# Patient Record
Sex: Male | Born: 1960
Health system: Southern US, Community
[De-identification: ages and names within clinical notes are randomized; demographics above are authoritative.]

## PROBLEM LIST (undated history)

## (undated) DIAGNOSIS — E11319 Type 2 diabetes mellitus with unspecified diabetic retinopathy without macular edema: Secondary | ICD-10-CM

## (undated) DIAGNOSIS — B001 Herpesviral vesicular dermatitis: Secondary | ICD-10-CM

## (undated) DIAGNOSIS — E119 Type 2 diabetes mellitus without complications: Secondary | ICD-10-CM

## (undated) DIAGNOSIS — E785 Hyperlipidemia, unspecified: Secondary | ICD-10-CM

## (undated) DIAGNOSIS — I10 Essential (primary) hypertension: Secondary | ICD-10-CM

## (undated) DIAGNOSIS — N529 Male erectile dysfunction, unspecified: Secondary | ICD-10-CM

## (undated) DIAGNOSIS — H269 Unspecified cataract: Secondary | ICD-10-CM

## (undated) DIAGNOSIS — R319 Hematuria, unspecified: Secondary | ICD-10-CM

## (undated) HISTORY — DX: Type 2 diabetes mellitus with unspecified diabetic retinopathy without macular edema: E11.319

## (undated) HISTORY — DX: Hematuria, unspecified: R31.9

## (undated) HISTORY — DX: Male erectile dysfunction, unspecified: N52.9

## (undated) HISTORY — DX: Unspecified cataract: H26.9

## (undated) HISTORY — DX: Essential (primary) hypertension: I10

## (undated) HISTORY — PX: KNEE ARTHROSCOPY: SUR90

## (undated) HISTORY — DX: Type 2 diabetes mellitus without complications: E11.9

## (undated) HISTORY — DX: Hyperlipidemia, unspecified: E78.5

## (undated) HISTORY — DX: Herpesviral vesicular dermatitis: B00.1

## (undated) NOTE — *Deleted (*Deleted)
[facility] - Clinic Note  04/26/2020  CHIEF COMPLAINT Patient presents for No chief complaint on file.  HISTORY OF PRESENT ILLNESS: Derrick Terry is a 45 y.o. male who presents to the clinic today for:   pt here for focal laser OD today   Referring physician: [name], MD [phone] [address] [place],  [place] [address]  HISTORICAL INFORMATION:   Selected notes from the MEDICAL RECORD NUMBER DEE - Referred by PCP, Dr. [name]   CURRENT MEDICATIONS: Current Outpatient Medications (Ophthalmic Drugs)  Medication Sig  . brimonidine (ALPHAGAN) 0.2 % ophthalmic solution Place 1 drop into both eyes 2 (two) times daily.  . prednisoLONE acetate (PRED FORTE) 1 % ophthalmic suspension Place 1 drop into the right eye 4 (four) times daily.   No current facility-administered medications for this visit. (Ophthalmic Drugs)   Current Outpatient Medications (Other)  Medication Sig  . atorvastatin (LIPITOR) 10 MG tablet Take 10 mg by mouth daily.  . B-D UF III MINI PEN NEEDLES 31G X 5 MM MISC USE TO ADMINISTER VICTOZA ONCE A DAY  . celecoxib (CELEBREX) 200 MG capsule Take 200 mg by mouth daily.  . CONTOUR NEXT TEST test strip USE STRIP TO CHECK GLUCOSE ONCE DAILY  . cyclobenzaprine (FLEXERIL) 10 MG tablet Take 1 tablet (10 mg total) by mouth 2 (two) times daily as needed.  . Empagliflozin-metFORMIN HCl (SYNJARDY PO) Take by mouth.  . levocetirizine (XYZAL) 5 MG tablet Take 5 mg by mouth every evening.  . lidocaine (XYLOCAINE) 5 % ointment Apply topically as needed.  . Liraglutide (VICTOZA [place]) Inject into the skin daily.  . metFORMIN (GLUCOPHAGE) 1000 MG tablet Take 1,000 mg by mouth 2 (two) times daily with a meal.  . Microlet Lancets MISC USE TO CHECK GLUCOSE ONCE DAILY  . phenazopyridine (PYRIDIUM) 100 MG tablet Take 100 mg by mouth 3 (three) times daily as needed.  . pioglitazone (ACTOS) 45 MG tablet Take 45 mg by mouth daily.  . predniSONE  (STERAPRED UNI-PAK 48 TAB) 5 MG (48) TBPK tablet See admin instructions. (Patient not taking: Reported on 04/04/2020)  . quinapril (ACCUPRIL) 10 MG tablet Take 10 mg by mouth daily.  . tadalafil (CIALIS) 20 MG tablet Take 20 mg by mouth daily as needed.  . valACYclovir (VALTREX) 1000 MG tablet SMARTSIG:2 Tablet(s) By Mouth Every 12 Hours PRN  . valACYclovir (VALTREX) 500 MG tablet Take 500 mg by mouth as needed.   Marland Kitchen VICTOZA 18 MG/3ML SOPN Inject 1.8 mg into the skin daily.   No current facility-administered medications for this visit. (Other)      REVIEW OF SYSTEMS:    ALLERGIES Allergies  Allergen Reactions  . Toradol [Ketorolac Tromethamine]     itching    PAST MEDICAL HISTORY Past Medical History:  Diagnosis Date  . Diabetes mellitus without complication (HCC)   . Diabetic retinopathy (HCC)   . ED (erectile dysfunction)   . Hematuria   . Herpes labialis    Past Surgical History:  Procedure Laterality Date  . KNEE ARTHROSCOPY Left   . KNEE ARTHROSCOPY Right     FAMILY HISTORY No family history on file.  SOCIAL HISTORY Social History   Tobacco Use  . Smoking status: Never Smoker  . Smokeless tobacco: Never Used  Substance Use Topics  . Alcohol use: No  . Drug use: No         OPHTHALMIC EXAM:  Not recorded     IMAGING AND  PROCEDURES  Imaging and Procedures for @TODAY @           ASSESSMENT/PLAN:    ICD-10-CM   1. Moderate nonproliferative diabetic retinopathy of both eyes with macular edema associated with type 2 diabetes mellitus (HCC)  Z61.0960   2. Retinal edema  H35.81 OCT, Retina - OU - Both Eyes  3. Combined forms of age-related cataract of both eyes  H25.813   4. Ocular hypertension, bilateral  H40.053   5. Hypertensive retinopathy of both eyes  H35.033     1,2. Moderate Non-proliferative diabetic retinopathy, OU             - S/P IVA OD #1 (11.03.20), #2 (12.01.20), #3 (01.15.21), #4 (02.12.21)  - S/P IVA OS #1 (11.16.20), #2  (01.15.21), #3 (02.12.21), #4 (03.12.21), #5 (04.09.21)  - discussed possible resistance to IVA OD and possible switch in medication  - S/P IVE OD #1 (03.12.21), #2 (04.09.21), #3 (05.13.21), #4 (06.10.21), #5 (07.15.21), #6 (08.30.21), #7 (09.27.21)             - S/P IVE OS #1 (05.13.21), #2 (06.10.21), #3 (07.15.21), #4 (08.30.21)             - S/P focal laser OS (09.13.21)  - S/P focal laser OD (11.01.21)  - FA (11.16.2020) shows late leaking MA OU, mild peripheral nonperfusion OU, no NV OU  - exam shows scattered MA, IRH, clustered centrally OU  - OCT shows OD: Mild Interval increase in central cysts; OS: mild Interval improvement in cystic changes IT macula (post focal laser)  - recommend IVE OU (OD #8 and OS #5) today, 11.23.21  - pt wishes to proceed with injections  - RBA of procedure discussed, questions answered  - Avastin informed consent form signed and scanned on 01.15.2021  - Eylea informed consent form signed and scanned on 03.12.2021  - see procedure note  - Eylea4U benefits investigation started 02.12.21 -- approved for 2021 (+CoPay card)  - f/u 3 weeks -- DFE/OCT, possible injection(s)  3. Mixed form age related cataract OU  - The symptoms of cataract, surgical options, and treatments and risks were discussed with patient.  - discussed diagnosis and progression  - not yet visually significant  - monitor for now  4. Ocular Hypertension OU  - IOP today -- 21,24  - cont brimonidine BID OU   Ophthalmic Meds Ordered this visit:  No orders of the defined types were placed in this encounter.  No follow-ups on file.  There are no Patient Instructions on file for this visit.  This document serves as a record of services personally performed by Karie Chimera, MD, PhD. It was created on their behalf by Glee Arvin. Manson Passey, OA an ophthalmic technician. The creation of this record is the provider's dictation and/or activities during the visit.    Electronically signed by:  Glee Arvin. Kristopher Oppenheim 11.22.2021 12:31 PM  Karie Chimera, M.D., Ph.D. Diseases & Surgery of the Retina and Vitreous [facility]   Abbreviations: M myopia (nearsighted); A astigmatism; H hyperopia (farsighted); P presbyopia; Mrx spectacle prescription;  CTL contact lenses; OD right eye; OS left eye; OU both eyes  XT exotropia; ET esotropia; PEK punctate epithelial keratitis; PEE punctate epithelial erosions; DES dry eye syndrome; MGD meibomian gland dysfunction; ATs artificial tears; PFAT's preservative free artificial tears; NSC nuclear sclerotic cataract; PSC posterior subcapsular cataract; ERM epi-retinal membrane; PVD posterior vitreous detachment; RD retinal detachment; DM diabetes mellitus; DR diabetic retinopathy; NPDR  non-proliferative diabetic retinopathy; PDR proliferative diabetic retinopathy; CSME clinically significant macular edema; DME diabetic macular edema; dbh dot blot hemorrhages; CWS cotton wool spot; POAG primary open angle glaucoma; C/D cup-to-disc ratio; HVF humphrey visual field; GVF goldmann visual field; OCT optical coherence tomography; IOP intraocular pressure; BRVO Branch retinal vein occlusion; CRVO central retinal vein occlusion; CRAO central retinal artery occlusion; BRAO branch retinal artery occlusion; RT retinal tear; SB scleral buckle; PPV pars plana vitrectomy; VH Vitreous hemorrhage; PRP panretinal laser photocoagulation; IVK intravitreal kenalog; VMT vitreomacular traction; MH Macular hole;  NVD neovascularization of the disc; NVE neovascularization elsewhere; AREDS age related eye disease study; ARMD age related macular degeneration; POAG primary open angle glaucoma; EBMD epithelial/anterior basement membrane dystrophy; ACIOL anterior chamber intraocular lens; IOL intraocular lens; PCIOL posterior chamber intraocular lens; Phaco/IOL phacoemulsification with intraocular lens placement; PRK photorefractive keratectomy; LASIK laser assisted in situ  keratomileusis; HTN hypertension; DM diabetes mellitus; COPD chronic obstructive pulmonary disease

---

## 2000-08-26 ENCOUNTER — Ambulatory Visit (HOSPITAL_COMMUNITY): Admission: RE | Admit: 2000-08-26 | Discharge: 2000-08-26 | Payer: Self-pay | Admitting: Specialist

## 2000-08-26 ENCOUNTER — Encounter: Payer: Self-pay | Admitting: Specialist

## 2008-11-09 ENCOUNTER — Encounter: Admission: RE | Admit: 2008-11-09 | Discharge: 2008-11-09 | Payer: Self-pay | Admitting: Unknown Physician Specialty

## 2008-12-01 ENCOUNTER — Ambulatory Visit (HOSPITAL_COMMUNITY): Admission: RE | Admit: 2008-12-01 | Discharge: 2008-12-01 | Payer: Self-pay | Admitting: Plastic Surgery

## 2012-12-26 ENCOUNTER — Encounter (INDEPENDENT_AMBULATORY_CARE_PROVIDER_SITE_OTHER): Payer: Self-pay

## 2013-01-06 ENCOUNTER — Ambulatory Visit (INDEPENDENT_AMBULATORY_CARE_PROVIDER_SITE_OTHER): Payer: Self-pay | Admitting: General Surgery

## 2013-01-13 ENCOUNTER — Ambulatory Visit (INDEPENDENT_AMBULATORY_CARE_PROVIDER_SITE_OTHER): Payer: PRIVATE HEALTH INSURANCE | Admitting: General Surgery

## 2013-01-13 ENCOUNTER — Encounter (INDEPENDENT_AMBULATORY_CARE_PROVIDER_SITE_OTHER): Payer: Self-pay | Admitting: General Surgery

## 2013-01-13 VITALS — BP 120/80 | HR 84 | Temp 97.8°F | Resp 12 | Ht 70.0 in | Wt 190.0 lb

## 2013-01-13 DIAGNOSIS — K6289 Other specified diseases of anus and rectum: Secondary | ICD-10-CM

## 2013-01-13 DIAGNOSIS — K602 Anal fissure, unspecified: Secondary | ICD-10-CM

## 2013-01-13 MED ORDER — LIDOCAINE 5 % EX OINT: TOPICAL_OINTMENT | CUTANEOUS | Status: AC | PRN

## 2013-01-13 NOTE — Progress Notes (Signed)
Chief Complaint  Patient presents with  . Rectal Problems    HISTORY: Derrick Terry is a 52 y.o. male who presents to the office with anal pain.  Other symptoms include occasional blood on toilet paper.  This had been occurring for about 6 months.  He has tried hemorrhoid creams, increasing fiber and stool softeners in the past with some success.  BM's, sitting, coughing makes the symptoms worse.   He is afraid to have a BM.  It is continuous in nature.  His bowel habits are irregular and his bowel movements are soft.  His fiber intake is through a supplement.  His last colonoscopy was ~3 yrs ago and negative.       Past Medical History  Diagnosis Date  . Diabetes mellitus without complication   . Hypertension   . ED (erectile dysfunction)   . Herpes labialis   . Hematuria   . Hyperlipidemia   . Diabetic retinopathy       Past Surgical History  Procedure Laterality Date  . Knee arthroscopy Left   . Knee arthroscopy Right         Current Outpatient Prescriptions  Medication Sig Dispense Refill  . celecoxib (CELEBREX) 200 MG capsule Take 200 mg by mouth daily.      Marland Kitchen levocetirizine (XYZAL) 5 MG tablet Take 5 mg by mouth every evening.      . Liraglutide (VICTOZA [place]) Inject into the skin daily.      . metFORMIN (GLUCOPHAGE) 1000 MG tablet Take 1,000 mg by mouth 2 (two) times daily with a meal.      . quinapril (ACCUPRIL) 10 MG tablet Take 10 mg by mouth daily.      . valACYclovir (VALTREX) 500 MG tablet Take 500 mg by mouth as needed.       . lidocaine (XYLOCAINE) 5 % ointment Apply topically as needed.  35.44 g  0   No current facility-administered medications for this visit.      No Known Allergies    No family history on file.  History   Social History  . Marital Status: Single    Spouse Name: N/A    Number of Children: N/A  . Years of Education: N/A   Social History Main Topics  . Smoking status: Never Smoker   . Smokeless tobacco: None  . Alcohol Use: No  .  Drug Use: No  . Sexually Active: None   Other Topics Concern  . None   Social History Narrative  . None      REVIEW OF SYSTEMS - PERTINENT POSITIVES ONLY: Review of Systems - General ROS: negative for - chills, fever or weight loss Hematological and Lymphatic ROS: negative for - bleeding problems, blood clots or bruising Respiratory ROS: no cough, shortness of breath, or wheezing Cardiovascular ROS: no chest pain or dyspnea on exertion Gastrointestinal ROS: no abdominal pain, change in bowel habits, or black or bloody stools Genito-Urinary ROS: no dysuria, trouble voiding, or hematuria  EXAM: Filed Vitals:   01/13/13 0918  BP: 120/80  Pulse: 84  Temp: 97.8 F (36.6 C)  Resp: 12    General appearance: alert and cooperative Resp: clear to auscultation bilaterally Cardio: regular rate and rhythm GI: soft, non-tender; bowel sounds normal; no masses,  no organomegaly  Ana Exam Findings: post fissure    ASSESSMENT AND PLAN: Derrick Terry is a 52 y.o. M with a chronic anal fissure.  I will start him on diltiazem cream along with Sitz  baths.  He will continue the stool softener and stop the proctozone.  I will see him back in 6 weeks.    Derrick Panda, MD Colon and Rectal Surgery / General Surgery [facility]      Visit Diagnoses: 1. Anal fissure   2. Pain, anal     Primary Care Physician: Derrick Blamer, MD

## 2013-01-13 NOTE — Patient Instructions (Signed)

## 2013-02-24 ENCOUNTER — Encounter (INDEPENDENT_AMBULATORY_CARE_PROVIDER_SITE_OTHER): Payer: Self-pay | Admitting: General Surgery

## 2013-02-24 ENCOUNTER — Ambulatory Visit (INDEPENDENT_AMBULATORY_CARE_PROVIDER_SITE_OTHER): Payer: PRIVATE HEALTH INSURANCE | Admitting: General Surgery

## 2013-02-24 VITALS — BP 118/82 | HR 84 | Temp 98.4°F | Resp 18 | Wt 188.5 lb

## 2013-02-24 DIAGNOSIS — K602 Anal fissure, unspecified: Secondary | ICD-10-CM

## 2013-02-24 NOTE — Patient Instructions (Signed)
Cont Diltiazem cream for 4-6 more weeks.  Call the office if your symptoms do not go away.

## 2013-02-24 NOTE — Progress Notes (Signed)
Derrick Terry is a 52 y.o. male who is here for a follow up visit regarding his chronic anal fissure.  HISTORY: Derrick Terry is a 52 y.o. male who presented to the office with anal pain. Other symptoms include occasional blood on toilet paper. This had been occurring for about 6 months. He has tried hemorrhoid creams, increasing fiber and stool softeners in the past with some success. BM's, sitting, coughing makes the symptoms worse. He is afraid to have a BM. It is continuous in nature. His bowel habits are irregular and his bowel movements are soft. His fiber intake is through a supplement. His last colonoscopy was ~3 yrs ago and negative.    Objective: Filed Vitals:   02/24/13 1014  BP: 118/82  Pulse: 84  Temp: 98.4 F (36.9 C)  Resp: 18    General appearance: alert and cooperative Anal: fissure healing, int exam reveals no palpable masses  Assessment and Plan: Doing well.  He will call the office if his symptoms don't cont to improve.  I will refill his diltiazem    .Vanita Panda, MD [facility], [place] [phone]

## 2014-11-12 ENCOUNTER — Other Ambulatory Visit: Payer: Self-pay | Admitting: Orthopedic Surgery

## 2014-11-19 ENCOUNTER — Other Ambulatory Visit: Payer: Self-pay | Admitting: Physician Assistant

## 2014-11-19 ENCOUNTER — Ambulatory Visit
Admission: RE | Admit: 2014-11-19 | Discharge: 2014-11-19 | Disposition: A | Discharge status: Home / Self Care | Payer: PRIVATE HEALTH INSURANCE | Source: Ambulatory Visit | Attending: Physician Assistant | Admitting: Physician Assistant

## 2014-11-19 DIAGNOSIS — Z01818 Encounter for other preprocedural examination: Secondary | ICD-10-CM

## 2014-12-16 ENCOUNTER — Inpatient Hospital Stay (HOSPITAL_COMMUNITY): Admission: RE | Admit: 2014-12-16 | Payer: Self-pay | Source: Ambulatory Visit | Admitting: Specialist

## 2014-12-16 ENCOUNTER — Encounter (HOSPITAL_COMMUNITY): Admission: RE | Payer: Self-pay | Source: Ambulatory Visit

## 2014-12-16 SURGERY — ARTHROPLASTY, KNEE, TOTAL
Anesthesia: Spinal | Site: Knee | Laterality: Right

## 2015-01-10 ENCOUNTER — Ambulatory Visit (INDEPENDENT_AMBULATORY_CARE_PROVIDER_SITE_OTHER): Payer: PRIVATE HEALTH INSURANCE | Admitting: Rehabilitative and Restorative Service Providers"

## 2015-01-10 ENCOUNTER — Encounter: Payer: Self-pay | Admitting: Rehabilitative and Restorative Service Providers"

## 2015-01-10 DIAGNOSIS — M25662 Stiffness of left knee, not elsewhere classified: Secondary | ICD-10-CM | POA: Diagnosis not present

## 2015-01-10 DIAGNOSIS — Z7409 Other reduced mobility: Secondary | ICD-10-CM | POA: Diagnosis not present

## 2015-01-10 DIAGNOSIS — R29898 Other symptoms and signs involving the musculoskeletal system: Secondary | ICD-10-CM

## 2015-01-10 DIAGNOSIS — M25661 Stiffness of right knee, not elsewhere classified: Secondary | ICD-10-CM

## 2015-01-10 DIAGNOSIS — R269 Unspecified abnormalities of gait and mobility: Secondary | ICD-10-CM

## 2015-01-10 DIAGNOSIS — R531 Weakness: Secondary | ICD-10-CM

## 2015-01-10 NOTE — Patient Instructions (Signed)
*   Please see hand outs on ways to increase knee flexion and extension.   Straight Leg Raise   Tighten stomach and slowly raise locked right leg __6-8__ inches from floor. Repeat __10__ times per set. Do _2-3___ sets per session. Do ___1_ sessions per day.    Hip Extension (Prone)   Lift left leg _2_ inches from floor, keeping knee locked. Repeat __10__ times per set. Do _2-3___ sets per session. Do _1___ sessions per day.  Strengthening: Hip Abduction (Side2-Lying)   Tighten muscles on front of left thigh, then lift leg _6-8___ inches from surface, keeping knee locked.  Repeat _10___ times per set. Do _2-3___ sets per session. Do __1__ sessions per day.    [facility] [address] [place], [place] [address]  [phone] (office) [phone] (fax)

## 2015-01-10 NOTE — Therapy (Signed)
[facility] [place] [address] [place], [place], [address] Phone: [phone]   Fax:  [phone]  Physical Therapy Evaluation  Patient Details  Name: Derrick Terry MRN: [id number] Date of Birth: 03/11/1961 Referring Provider:  Tarry Kos, MD  Encounter Date: 01/10/2015      PT End of Session - 01/10/15 0829    Visit Number 1   Number of Visits 16   Date for PT Re-Evaluation 03/07/15   PT Start Time 0805   PT Stop Time 0910   PT Time Calculation (min) 65 min   Activity Tolerance Patient tolerated treatment well      Past Medical History  Diagnosis Date  . Diabetes mellitus without complication   . Hypertension   . ED (erectile dysfunction)   . Herpes labialis   . Hematuria   . Hyperlipidemia   . Diabetic retinopathy     Past Surgical History  Procedure Laterality Date  . Knee arthroscopy Left   . Knee arthroscopy Right     There were no vitals filed for this visit.  Visit Diagnosis:  Weakness of left lower extremity - Plan: PT plan of care cert/re-cert  Stiffness of joint, lower leg, left - Plan: PT plan of care cert/re-cert  Abnormality of gait - Plan: PT plan of care cert/re-cert  Decreased strength, endurance, and mobility - Plan: PT plan of care cert/re-cert          [facility] PT Assessment - 01/10/15 0001    Assessment   Medical Diagnosis Rt TKA   Onset Date/Surgical Date 12/16/14   Hand Dominance Right   Next MD Visit 01/17/15   Restrictions   Weight Bearing Restrictions No   Home Environment   Additional Comments Home on step entry/ two story house - bedroom on sencond floor railing Rt 14 steps   Prior Function   Level of Independence Independent   Vocation Full time employment   Producer, 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%; desk/computer; no lifting   Leisure runs a youth program - in the gym a lot    Observation/Other Assessments   Focus on Therapeutic Outcomes  (FOTO)  72% limitation   Sensation   Additional Comments WNL's per patient report   Posture/Postural Control   Posture Comments stands with Rt hip and knee flexed trunk flexed forward and weight shifted to the Lt   AROM   Overall AROM Comments Lt LE WNL's throughout; Rt hip/ankle WNL's tightness Rt hamstrings with knee flexed at 75 degrees; Lt hamstring flexibility 100 degrees   Right Knee Extension -10   Right Knee Flexion 99   Left Knee Extension --  (+)3   Left Knee Flexion 135   Strength   Overall Strength Comments Lt LE WNL's    Right Hip Flexion 4+/5   Right Hip Extension 4-/5   Right Hip ABduction 4-/5   Right Hip ADduction 4/5   Right Knee Flexion 4+/5   Right Knee Extension 4/5   Palpation   Patella mobility good mobility - some tightness with caudal glide   Palpation comment tightness noted through quads Rt   Ambulation/Gait   Gait Comments decreased weight bearing Rt LE; trunk/hip/knee flexed           [facility] Adult PT Treatment/Exercise - 01/10/15 0001    Ambulation/Gait   Ambulation/Gait Yes   Ambulation/Gait Assistance 6: Modified independent (Device/Increase time)  (decreased speed)   Ambulation Distance (Feet) 80 Feet   Assistive device None  Gait Pattern Step-through pattern;Decreased arm swing - right;Decreased step length - left;Decreased stance time - right;Decreased hip/knee flexion - right;Right flexed knee in stance   Exercises   Exercises Knee/Hip   Knee/Hip Exercises: Seated   Other Seated Knee/Hip Exercises Seated scoots with 5 sec hold in knee flexion x 10 reps    Sit to Sand 5 reps  VC for wt shift to Rt    Knee/Hip Exercises: Supine   Straight Leg Raises Right;1 set;10 reps   Straight Leg Raises Limitations demo some extensor lag   Straight Leg Raise with External Rotation Right;1 set;10 reps  challenging   Knee/Hip Exercises: Sidelying   Hip ABduction Strengthening;Right;2 sets;10 reps   Knee/Hip Exercises: Prone   Hip Extension Right;2  sets;10 reps   Modalities   Modalities Vasopneumatic   Vasopneumatic   Number Minutes Vasopneumatic  15 minutes   Vasopnuematic Location  Knee   Vasopneumatic Pressure Medium   Vasopneumatic Temperature  32            PT Short Term Goals - 01/10/15 0834    PT SHORT TERM GOAL #1   Title Patient I in initial HEP - 02/08/15   Time 4   Period Weeks   Status New   PT SHORT TERM GOAL #2   Title Increase ROM Rt knee extension to 0 degrees and flexion to 125 degrees 02/08/15   Time 4   Period Weeks   Status New   PT SHORT TERM GOAL #3   Title Increase strength Rt LE to 5-/5 50 5/5 02/14/15   Time 5   Period Weeks   Status New           PT Long Term Goals - 01/10/15 0836    PT LONG TERM GOAL #1   Title Patient I in discharge exercise program 03/07/15   Time 8   Period Weeks   Status New   PT LONG TERM GOAL #2   Title Full painfree ROM Rt knee 0-130 degrees 03/07/15   Time 8   Period Weeks   Status New   PT LONG TERM GOAL #3   Title 5/5 strength Rt LE 03/07/15   Time 8   Period Weeks   Status New   PT LONG TERM GOAL #4   Title Return to normal functional activity level 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    Pt will benefit from skilled therapeutic intervention in order to improve on the following deficits Abnormal gait;Decreased range of motion;Decreased strength;Decreased activity tolerance;Decreased balance   Rehab Potential Good   PT Frequency 2x / week   PT Duration 8 weeks   PT Treatment/Interventions Patient/family education;ADLs/Self Care Home Management;Therapeutic  exercise;Therapeutic activities;Neuromuscular re-education;Cryotherapy;Electrical Stimulation;Moist Heat;Ultrasound;Functional mobility training;Dry needling   PT Next Visit Plan Review exercises; progress with strengthening and ROM exercises; gait training   PT Home Exercise Plan ROM/strengthening/weight bearing   Consulted and Agree with Plan of Care Patient  Problem List Patient Active Problem List   Diagnosis Date Noted  . Anal fissure 01/13/2013    Khadijah Mastrianni Rober Minion, PT, MPH 01/10/2015, 11:45 AM  [facility] [address] [place], [place], [address] Phone: [phone]   Fax:  [phone]

## 2015-01-13 ENCOUNTER — Ambulatory Visit (INDEPENDENT_AMBULATORY_CARE_PROVIDER_SITE_OTHER): Payer: PRIVATE HEALTH INSURANCE | Admitting: Physical Therapy

## 2015-01-13 DIAGNOSIS — R269 Unspecified abnormalities of gait and mobility: Secondary | ICD-10-CM

## 2015-01-13 DIAGNOSIS — M25662 Stiffness of left knee, not elsewhere classified: Secondary | ICD-10-CM | POA: Diagnosis not present

## 2015-01-13 DIAGNOSIS — R29898 Other symptoms and signs involving the musculoskeletal system: Secondary | ICD-10-CM

## 2015-01-13 DIAGNOSIS — R531 Weakness: Secondary | ICD-10-CM

## 2015-01-13 DIAGNOSIS — Z7409 Other reduced mobility: Secondary | ICD-10-CM

## 2015-01-13 NOTE — Therapy (Signed)
[facility] [place] [address] [place], [place], [address] Phone: [phone]   Fax:  [phone]  Physical Therapy Treatment  Patient Details  Name: Derrick Terry MRN: [id number] Date of Birth: 04-28-1961 Referring Provider:  Tarry Kos, MD  Encounter Date: 01/13/2015      PT End of Session - 01/13/15 0806    Visit Number 2   Number of Visits 16   Date for PT Re-Evaluation 03/07/15   PT Start Time 0803   PT Stop Time 0903   PT Time Calculation (min) 60 min   Activity Tolerance Patient tolerated treatment well      Past Medical History  Diagnosis Date  . Diabetes mellitus without complication   . Hypertension   . ED (erectile dysfunction)   . Herpes labialis   . Hematuria   . Hyperlipidemia   . Diabetic retinopathy     Past Surgical History  Procedure Laterality Date  . Knee arthroscopy Left   . Knee arthroscopy Right     There were no vitals filed for this visit.  Visit Diagnosis:  Weakness of left lower extremity  Stiffness of joint, lower leg, left  Abnormality of gait  Decreased strength, endurance, and mobility      Subjective Assessment - 01/13/15 0806    Subjective Pt reports soreness/stiffness in Rt knee.  Compliant with HEP.    Currently in Pain? Yes   Pain Score 2    Pain Location Knee   Pain Orientation Right   Pain Descriptors / Indicators Aching   Aggravating Factors  bending knee    Pain Relieving Factors ice/heat             [facility] PT Assessment - 01/13/15 0001    Assessment   Medical Diagnosis Rt TKA   Onset Date/Surgical Date 12/16/14   Hand Dominance Right   Next MD Visit 01/17/15           [facility] Adult PT Treatment/Exercise - 01/13/15 0001    Ambulation/Gait   Ambulation/Gait Yes   Ambulation/Gait Assistance 6: Modified independent (Device/Increase time)  increased time   Ambulation Distance (Feet) 80 Feet   Assistive device None   Gait Pattern  Step-through pattern;Decreased step length - left;Right flexed knee in stance   Gait Comments VC to decrease step length on Rt, increase Rt heel strike.  Improved step length and weight shift after VC delivered.    Knee/Hip Exercises: Standing   Heel Raises Both;2 sets;10 reps   Heel Raises Limitations VC to increase WB into Rt   Lateral Step Up Left;1 set;Hand Hold: 0;Step Height: 6";15 reps   Forward Step Up Left;1 set;10 reps;Step Height: 6";Hand Hold: 1   Step Down Left;10 reps;Hand Hold: 2;2 sets  1 set on 3", 1 set on 6"   SLS RLE on blue pad x 15 sec x 4 trials.    Other Standing Knee Exercises TKE with green band x 3 sec hold in ext x 10 reps   Knee/Hip Exercises: Prone   Hamstring Curl 1 set;15 reps   Prone Knee Hang 3 minutes  with pillow above knee   Modalities   Modalities Vasopneumatic;Electrical Stimulation   Electrical Stimulation   Electrical Stimulation Location Rt knee   Electrical Stimulation Action ion repelling   Electrical Stimulation Parameters high volt/ to tolerance, x 15 min    Electrical Stimulation Goals Edema;Pain   Vasopneumatic   Number Minutes Vasopneumatic  15 minutes   Vasopnuematic Location  Knee  Rt   Vasopneumatic Pressure Medium   Vasopneumatic Temperature  34 deg           PT Education - 01/13/15 0856    Education provided Yes   Education Details self care: scar massage    Person(s) Educated Patient;Spouse   Methods Demonstration;Explanation   Comprehension Verbalized understanding          PT Short Term Goals - 01/10/15 0834    PT SHORT TERM GOAL #1   Title Patient I in initial HEP - 02/08/15   Time 4   Period Weeks   Status New   PT SHORT TERM GOAL #2   Title Increase ROM Rt knee extension to 0 degrees and flexion to 125 degrees 02/08/15   Time 4   Period Weeks   Status New   PT SHORT TERM GOAL #3   Title Increase strength Rt LE to 5-/5 50 5/5 02/14/15   Time 5   Period Weeks   Status New           PT Long  Term Goals - 01/10/15 0836    PT LONG TERM GOAL #1   Title Patient I in discharge exercise program 03/07/15   Time 8   Period Weeks   Status New   PT LONG TERM GOAL #2   Title Full painfree ROM Rt knee 0-130 degrees 03/07/15   Time 8   Period Weeks   Status New   PT LONG TERM GOAL #3   Title 5/5 strength Rt LE 03/07/15   Time 8   Period Weeks   Status New   PT LONG TERM GOAL #4   Title 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  Pt did become slightly diaphoretic / nauseous after stair training; resolved with seated rest and drink of water. Pt's wife indicated afterward that pt is diabetic and declined to eat breakfast prior to session.  Pt encouraged to eat prior to future therapy sessions.   Pt making good progress towards goals.    Pt will benefit from skilled therapeutic intervention in order to improve on the following deficits Abnormal gait;Decreased range of motion;Decreased strength;Decreased activity tolerance;Decreased balance   PT Frequency 2x / week   PT Duration 8 weeks   PT Treatment/Interventions Patient/family education;ADLs/Self Care Home Management;Therapeutic exercise;Therapeutic activities;Neuromuscular re-education;Cryotherapy;Electrical Stimulation;Moist Heat;Ultrasound;Functional mobility training;Dry needling   PT Next Visit Plan Review exercises; progress with strengthening and ROM exercises; gait training   Consulted and Agree with Plan of Care Patient        Problem List Patient Active Problem List   Diagnosis Date Noted  . Anal fissure 01/13/2013   Mayer Camel, PTA 01/13/2015 9:14 AM  [facility] [address] [place] [place], [place], [address] Phone: [phone]   Fax:  [phone]

## 2015-01-17 ENCOUNTER — Ambulatory Visit (INDEPENDENT_AMBULATORY_CARE_PROVIDER_SITE_OTHER): Payer: PRIVATE HEALTH INSURANCE | Admitting: Physical Therapy

## 2015-01-17 DIAGNOSIS — R29898 Other symptoms and signs involving the musculoskeletal system: Secondary | ICD-10-CM | POA: Diagnosis not present

## 2015-01-17 DIAGNOSIS — R269 Unspecified abnormalities of gait and mobility: Secondary | ICD-10-CM | POA: Diagnosis not present

## 2015-01-17 DIAGNOSIS — M25662 Stiffness of left knee, not elsewhere classified: Secondary | ICD-10-CM | POA: Diagnosis not present

## 2015-01-17 DIAGNOSIS — Z7409 Other reduced mobility: Secondary | ICD-10-CM

## 2015-01-17 DIAGNOSIS — R531 Weakness: Secondary | ICD-10-CM

## 2015-01-17 DIAGNOSIS — R6889 Other general symptoms and signs: Secondary | ICD-10-CM

## 2015-01-17 NOTE — Therapy (Signed)
[facility] [place] [address] [place] [address] [place], [place], [address] Phone: [phone]   Fax:  [phone]  Physical Therapy Treatment  Patient Details  Name: Derrick Terry MRN: [id number] Date of Birth: 12/20/1960 Referring Provider:  Tarry Kos, MD  Encounter Date: 01/17/2015      PT End of Session - 01/17/15 0704    Visit Number 3   Number of Visits 16   Date for PT Re-Evaluation 03/07/15   PT Start Time 0701   PT Stop Time 0746   PT Time Calculation (min) 45 min      Past Medical History  Diagnosis Date  . Diabetes mellitus without complication   . Hypertension   . ED (erectile dysfunction)   . Herpes labialis   . Hematuria   . Hyperlipidemia   . Diabetic retinopathy     Past Surgical History  Procedure Laterality Date  . Knee arthroscopy Left   . Knee arthroscopy Right     There were no vitals filed for this visit.  Visit Diagnosis:  Weakness of left lower extremity  Abnormality of gait  Stiffness of joint, lower leg, left  Decreased strength, endurance, and mobility      Subjective Assessment - 01/17/15 0704    Subjective Pt reports he worked out and went swimming this weekend; yesterday Rt knee throbbing and painful.  He took pain medicine yesterday/ today.    Currently in Pain? Yes   Pain Score 4    Pain Location Knee   Pain Orientation Right   Pain Descriptors / Indicators Tightness   Aggravating Factors  bending knee    Pain Relieving Factors ice/heat             [facility] PT Assessment - 01/17/15 0001    Assessment   Medical Diagnosis Rt TKA   Onset Date/Surgical Date 12/16/14   Hand Dominance Right   Next MD Visit 01/17/15   AROM   Right Knee Extension -8   Right Knee Flexion 105                     [facility] Adult PT Treatment/Exercise - 01/17/15 0001    Knee/Hip Exercises: Aerobic   Elliptical L2: 4 min    Knee/Hip Exercises: Standing   Heel Raises Both;2 sets;10  reps   Side Lunges Right;Left;1 set;10 reps   Step Down Left;2 sets;10 reps  Lt heel taps, 3" step. BUE support   SLS RLE on blue pad x 15 sec x 4 trials.    Other Standing Knee Exercises Forward lunges on 13" step followed by hamstring stretch x 10 reps    Other Standing Knee Exercises Side stepping with green band at ankle x 20 ft Rt/ Lt; Marching with green band at ankles x 15 ft x 2   Modalities   Modalities Electrical Stimulation;Vasopneumatic   Electrical Stimulation   Electrical Stimulation Location Rt knee   Electrical Stimulation Action ion repelling   Electrical Stimulation Parameters high volt; 15 min   Electrical Stimulation Goals Edema;Pain   Vasopneumatic   Number Minutes Vasopneumatic  15 minutes   Vasopnuematic Location  Knee   Vasopneumatic Pressure Medium   Vasopneumatic Temperature  34 degrees                PT Education - 01/17/15 1221    Education provided Yes   Education Details Pt encouraged to continue working on extension ROM.  In past, wife has assisted with gentle  over-pressure to increase Rt knee  ext. Spoke to both pt and his wife about trying this again at home.    Person(s) Educated Patient;Spouse   Methods Demonstration;Explanation   Comprehension Verbalized understanding          PT Short Term Goals - 01/10/15 [phone]    PT SHORT TERM GOAL #1   Title Patient I in initial HEP - 02/08/15   Time 4   Period Weeks   Status New   PT SHORT TERM GOAL #2   Title Increase ROM Rt knee extension to 0 degrees and flexion to 125 degrees 02/08/15   Time 4   Period Weeks   Status New   PT SHORT TERM GOAL #3   Title Increase strength Rt LE to 5-/5 50 5/5 02/14/15   Time 5   Period Weeks   Status New           PT Long Term Goals - 01/10/15 0836    PT LONG TERM GOAL #1   Title Patient I in discharge exercise program 03/07/15   Time 8   Period Weeks   Status New   PT LONG TERM GOAL #2   Title Full painfree ROM Rt knee 0-130 degrees  03/07/15   Time 8   Period Weeks   Status New   PT LONG TERM GOAL #3   Title 5/5 strength Rt LE 03/07/15   Time 8   Period Weeks   Status New   PT LONG TERM GOAL #4   Title 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  Pt tolerated all new exercises with slight increase in pain in Rt knee; reduced with use of vaso/estim. Progressing towards goals.    Pt will benefit from skilled therapeutic intervention in order to improve on the following deficits Abnormal gait;Decreased range of motion;Decreased strength;Decreased activity tolerance;Decreased balance   Rehab Potential Good   PT Frequency 2x / week   PT Duration 8 weeks   PT Treatment/Interventions Patient/family education;ADLs/Self Care Home Management;Therapeutic exercise;Therapeutic activities;Neuromuscular re-education;Cryotherapy;Electrical Stimulation;Moist Heat;Ultrasound;Functional mobility training;Dry needling   PT Next Visit Plan Review exercises; progress with strengthening and ROM exercises; gait training   Consulted and Agree with Plan of Care Patient        Problem List Patient Active Problem List   Diagnosis Date Noted  . Anal fissure 01/13/2013    Mayer Camel, PTA 01/17/2015 12:22 PM   [facility] [place] [address] [place], [place], [address] Phone: [phone]   Fax:  [phone]

## 2015-01-20 ENCOUNTER — Ambulatory Visit (INDEPENDENT_AMBULATORY_CARE_PROVIDER_SITE_OTHER): Payer: PRIVATE HEALTH INSURANCE | Admitting: Physical Therapy

## 2015-01-20 DIAGNOSIS — R269 Unspecified abnormalities of gait and mobility: Secondary | ICD-10-CM

## 2015-01-20 DIAGNOSIS — R29898 Other symptoms and signs involving the musculoskeletal system: Secondary | ICD-10-CM | POA: Diagnosis not present

## 2015-01-20 DIAGNOSIS — Z7409 Other reduced mobility: Secondary | ICD-10-CM

## 2015-01-20 DIAGNOSIS — M25662 Stiffness of left knee, not elsewhere classified: Secondary | ICD-10-CM

## 2015-01-20 DIAGNOSIS — R6889 Other general symptoms and signs: Secondary | ICD-10-CM

## 2015-01-20 DIAGNOSIS — R531 Weakness: Secondary | ICD-10-CM

## 2015-01-20 NOTE — Therapy (Signed)
[facility] [place] [address] [place] [address] [place], [place], [address] Phone: [phone]   Fax:  [phone]  Physical Therapy Treatment  Patient Details  Name: Derrick Terry MRN: [id number] Date of Birth: 1961-04-29 Referring Provider:  Tarry Kos, MD  Encounter Date: 01/20/2015      PT End of Session - 01/20/15 0806    Visit Number 4   Number of Visits 16   Date for PT Re-Evaluation 03/07/15   PT Start Time 0802   PT Stop Time 0900   PT Time Calculation (min) 58 min   Activity Tolerance Patient tolerated treatment well      Past Medical History  Diagnosis Date  . Diabetes mellitus without complication   . Hypertension   . ED (erectile dysfunction)   . Herpes labialis   . Hematuria   . Hyperlipidemia   . Diabetic retinopathy     Past Surgical History  Procedure Laterality Date  . Knee arthroscopy Left   . Knee arthroscopy Right     There were no vitals filed for this visit.  Visit Diagnosis:  Weakness of left lower extremity  Abnormality of gait  Stiffness of joint, lower leg, left  Decreased strength, endurance, and mobility      Subjective Assessment - 01/20/15 0806    Subjective Nothing new to report.    Currently in Pain? Yes   Pain Score 2    Pain Location Knee   Pain Orientation Right   Pain Descriptors / Indicators Tightness   Aggravating Factors  bending knee   Pain Relieving Factors ice/heat             [facility] PT Assessment - 01/20/15 0001    AROM   Right Knee Extension -7   PROM   Right/Left Knee Right  to -1 with overpressure             [facility] Adult PT Treatment/Exercise - 01/20/15 0001    Knee/Hip Exercises: Stretches   Passive Hamstring Stretch Right;2 reps;30 seconds  with strap   Quad Stretch Right;30 seconds;3 reps   Other Knee/Hip Stretches Rt ITB stretch, 60 sec x 1 with strap   Knee/Hip Exercises: Aerobic   Elliptical L2: 4 min    Knee/Hip Exercises: Standing    Side Lunges Right;Left;1 set;10 reps   Lateral Step Up Right;1 set;15 reps   SLS RLE on blue pad with horiz head turns x 15 sec x 3; on grey pad x 20 sec x 2   Other Standing Knee Exercises Forward lunges on 13" step followed by hamstring stretch x 10 reps to increase Rt knee ROM   Knee/Hip Exercises: Supine   Quad Sets Right;5 reps   Other Supine Knee/Hip Exercises Bridge with feet on green therapy ball x 10;  bridge with hamstring curl on green therapy ball x 10    Modalities   Modalities Electrical Stimulation;Vasopneumatic   Electrical Stimulation   Electrical Stimulation Location Rt knee    Electrical Stimulation Action ion repelling   Electrical Stimulation Parameters high volt, to tolerance x 15 min    Electrical Stimulation Goals Pain;Edema   Vasopneumatic   Number Minutes Vasopneumatic  15 minutes   Vasopnuematic Location  Knee   Vasopneumatic Pressure Medium   Vasopneumatic Temperature  33 degrees                  PT Short Term Goals - 01/10/15 0834    PT SHORT TERM GOAL #1  Title Patient I in initial HEP - 02/08/15   Time 4   Period Weeks   Status New   PT SHORT TERM GOAL #2   Title Increase ROM Rt knee extension to 0 degrees and flexion to 125 degrees 02/08/15   Time 4   Period Weeks   Status New   PT SHORT TERM GOAL #3   Title Increase strength Rt LE to 5-/5 50 5/5 02/14/15   Time 5   Period Weeks   Status New           PT Long Term Goals - 01/10/15 0836    PT LONG TERM GOAL #1   Title Patient I in discharge exercise program 03/07/15   Time 8   Period Weeks   Status New   PT LONG TERM GOAL #2   Title Full painfree ROM Rt knee 0-130 degrees 03/07/15   Time 8   Period Weeks   Status New   PT LONG TERM GOAL #3   Title 5/5 strength Rt LE 03/07/15   Time 8   Period Weeks   Status New   PT LONG TERM GOAL #4   Title 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 Pt tolerated all new exercises with minimal increase in Rt knee pain.  Progressing towards goals.    Pt will benefit from skilled therapeutic intervention in order to improve on the following deficits Abnormal gait;Decreased range of motion;Decreased strength;Decreased activity tolerance;Decreased balance   Rehab Potential Good   PT Frequency 2x / week   PT Duration 8 weeks   PT Treatment/Interventions Patient/family education;ADLs/Self Care Home Management;Therapeutic exercise;Therapeutic activities;Neuromuscular re-education;Cryotherapy;Electrical Stimulation;Moist Heat;Ultrasound;Functional mobility training;Dry needling   PT Next Visit Plan Continue progressive ROM and strengthening to Rt knee.    Consulted and Agree with Plan of Care Patient        Problem List Patient Active Problem List   Diagnosis Date Noted  . Anal fissure 01/13/2013   Mayer Camel, PTA 01/20/2015 9:45 AM  [facility] [address] [place], [place], [address] Phone: [phone]   Fax:  [phone]

## 2015-01-24 ENCOUNTER — Ambulatory Visit (INDEPENDENT_AMBULATORY_CARE_PROVIDER_SITE_OTHER): Payer: PRIVATE HEALTH INSURANCE | Admitting: Physical Therapy

## 2015-01-24 DIAGNOSIS — R531 Weakness: Secondary | ICD-10-CM

## 2015-01-24 DIAGNOSIS — R29898 Other symptoms and signs involving the musculoskeletal system: Secondary | ICD-10-CM | POA: Diagnosis not present

## 2015-01-24 DIAGNOSIS — M25662 Stiffness of left knee, not elsewhere classified: Secondary | ICD-10-CM | POA: Diagnosis not present

## 2015-01-24 DIAGNOSIS — Z7409 Other reduced mobility: Secondary | ICD-10-CM

## 2015-01-24 DIAGNOSIS — R269 Unspecified abnormalities of gait and mobility: Secondary | ICD-10-CM | POA: Diagnosis not present

## 2015-01-24 DIAGNOSIS — R6889 Other general symptoms and signs: Secondary | ICD-10-CM

## 2015-01-24 NOTE — Therapy (Signed)
[facility] [place] [address] [place] [address] [place], [place], [address] Phone: [phone]   Fax:  [phone]  Physical Therapy Treatment  Patient Details  Name: Derrick Terry MRN: [id number] Date of Birth: May 04, 1961 Referring Provider:  Tarry Kos, MD  Encounter Date: 01/24/2015      PT End of Session - 01/24/15 0708    Visit Number 5   Number of Visits 16   Date for PT Re-Evaluation 03/07/15   PT Start Time 0703   PT Stop Time 0810   PT Time Calculation (min) 67 min   Activity Tolerance Patient tolerated treatment well      Past Medical History  Diagnosis Date  . Diabetes mellitus without complication   . Hypertension   . ED (erectile dysfunction)   . Herpes labialis   . Hematuria   . Hyperlipidemia   . Diabetic retinopathy     Past Surgical History  Procedure Laterality Date  . Knee arthroscopy Left   . Knee arthroscopy Right     There were no vitals filed for this visit.  Visit Diagnosis:  Weakness of left lower extremity  Abnormality of gait  Stiffness of joint, lower leg, left  Decreased strength, endurance, and mobility      Subjective Assessment - 01/24/15 0709    Subjective Pt reports he toned down his pool workout down; didn't flare next day like previous workout. Chief complaint is stiffness.    Currently in Pain? Yes   Pain Score 2    Pain Location Knee   Pain Orientation Right   Pain Descriptors / Indicators Tightness   Aggravating Factors  bending knee    Pain Relieving Factors ice/heat             [facility] PT Assessment - 01/24/15 0001    Assessment   Medical Diagnosis Rt TKA   Onset Date/Surgical Date 12/16/14   Hand Dominance Right   Next MD Visit 01/27/15   AROM   Right Knee Extension -3  (after manual therapy)   Right Knee Flexion 110   Strength   Right/Left Hip Right   Right Hip Flexion --  5-/5   Right Hip Extension 4/5   Right Hip ABduction 4+/5   Right/Left Knee  Right   Right Knee Flexion 4/5   Right Knee Extension 4+/5               [facility] Adult PT Treatment/Exercise - 01/24/15 0001    Self-Care   Self-Care Scar Mobilizations   Scar Mobilizations Re-education regarding scar massage and desensitizing scar area; pt verbalized understanding.    Knee/Hip Exercises: Stretches   Passive Hamstring Stretch Right;2 reps;30 seconds  with strap   Quad Stretch Right;30 seconds;3 reps   Gastroc Stretch 2 reps;Both;30 seconds   Other Knee/Hip Stretches Rt ITB stretch, 60 sec x 1 with strap   Knee/Hip Exercises: Aerobic   Stationary Bike L3: 6 min    Knee/Hip Exercises: Machines for Strengthening   Cybex Knee Extension 1 plate: RLE x 10; 2 plates:  RLE x 10 reps   Total Gym Leg Press 5 plates with RLE x 10 reps x 2 saets   Knee/Hip Exercises: Standing   Heel Raises Both;3 sets;10 reps  (toes straight, in, out)    Lateral Step Up Right;1 set;20 reps;Step Height: 8"  cross over step ups    Step Down Left;1 set;10 reps;Hand Hold: 1;Step Height: 8"   SLS Rt SLS on grey pad x 20  sec x 2; with horizontal head turns    Knee/Hip Exercises: Supine   Straight Leg Raise with External Rotation Strengthening;Right;1 set;10 reps   Straight Leg Raise with External Rotation Limitations tremulous   Modalities   Modalities Electrical Stimulation;Vasopneumatic   Electrical Stimulation   Electrical Stimulation Location Rt knee   Electrical Stimulation Action ion repelling    Electrical Stimulation Parameters high volt   Electrical Stimulation Goals Pain;Edema   Vasopneumatic   Number Minutes Vasopneumatic  15 minutes   Vasopnuematic Location  Knee  Rt   Vasopneumatic Pressure Medium   Vasopneumatic Temperature  32 degrees    Manual Therapy   Manual Therapy Joint mobilization   Joint Mobilization Rt patella mobs in all directions, over pressure into Rt knee extension x 30 sec x 5 reps                   PT Short Term Goals - 01/10/15 0834    PT  SHORT TERM GOAL #1   Title Patient I in initial HEP - 02/08/15   Time 4   Period Weeks   Status New   PT SHORT TERM GOAL #2   Title Increase ROM Rt knee extension to 0 degrees and flexion to 125 degrees 02/08/15   Time 4   Period Weeks   Status New   PT SHORT TERM GOAL #3   Title Increase strength Rt LE to 5-/5 50 5/5 02/14/15   Time 5   Period Weeks   Status New           PT Long Term Goals - 01/10/15 0836    PT LONG TERM GOAL #1   Title Patient I in discharge exercise program 03/07/15   Time 8   Period Weeks   Status New   PT LONG TERM GOAL #2   Title Full painfree ROM Rt knee 0-130 degrees 03/07/15   Time 8   Period Weeks   Status New   PT LONG TERM GOAL #3   Title 5/5 strength Rt LE 03/07/15   Time 8   Period Weeks   Status New   PT LONG TERM GOAL #4   Title 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  Pt tolerated new exercises with increased difficulty and resistance with minimal increase in pain.    Pt will benefit from skilled therapeutic intervention in order to improve on the following deficits Abnormal gait;Decreased range of motion;Decreased strength;Decreased activity tolerance;Decreased balance   PT Frequency 2x / week   PT Duration 8 weeks   PT Treatment/Interventions Patient/family education;ADLs/Self Care Home Management;Therapeutic exercise;Therapeutic activities;Neuromuscular re-education;Cryotherapy;Electrical Stimulation;Moist Heat;Ultrasound;Functional mobility training;Dry needling   PT Next Visit Plan Continue progressive ROM and strengthening to Rt knee.    Consulted and  Agree with Plan of Care Patient        Problem List Patient Active Problem List   Diagnosis Date Noted  . Anal fissure 01/13/2013   Mayer Camel, PTA 01/24/2015 8:15 AM  [facility] [place], [place], [address] Phone: [phone]   Fax:  [phone]

## 2015-01-27 ENCOUNTER — Ambulatory Visit (INDEPENDENT_AMBULATORY_CARE_PROVIDER_SITE_OTHER): Payer: PRIVATE HEALTH INSURANCE | Admitting: Physical Therapy

## 2015-01-27 DIAGNOSIS — R6889 Other general symptoms and signs: Secondary | ICD-10-CM

## 2015-01-27 DIAGNOSIS — Z7409 Other reduced mobility: Secondary | ICD-10-CM

## 2015-01-27 DIAGNOSIS — M25662 Stiffness of left knee, not elsewhere classified: Secondary | ICD-10-CM | POA: Diagnosis not present

## 2015-01-27 DIAGNOSIS — R269 Unspecified abnormalities of gait and mobility: Secondary | ICD-10-CM

## 2015-01-27 DIAGNOSIS — R531 Weakness: Secondary | ICD-10-CM

## 2015-01-27 DIAGNOSIS — R29898 Other symptoms and signs involving the musculoskeletal system: Secondary | ICD-10-CM

## 2015-01-27 DIAGNOSIS — M25661 Stiffness of right knee, not elsewhere classified: Secondary | ICD-10-CM

## 2015-01-27 NOTE — Therapy (Signed)
[facility] [place] [address] [place] [address] [place], [place], [address] Phone: [phone]   Fax:  [phone]  Physical Therapy Treatment  Patient Details  Name: Derrick Terry MRN: [id number] Date of Birth: 08/30/1960 Referring Provider:  Tarry Kos, MD  Encounter Date: 01/27/2015      PT End of Session - 01/27/15 1628    Visit Number 6   Number of Visits 16   Date for PT Re-Evaluation 03/07/15   PT Start Time 1615   PT Stop Time 1715   PT Time Calculation (min) 60 min   Activity Tolerance Patient tolerated treatment well      Past Medical History  Diagnosis Date  . Diabetes mellitus without complication   . Hypertension   . ED (erectile dysfunction)   . Herpes labialis   . Hematuria   . Hyperlipidemia   . Diabetic retinopathy     Past Surgical History  Procedure Laterality Date  . Knee arthroscopy Left   . Knee arthroscopy Right     There were no vitals filed for this visit.  Visit Diagnosis:  Abnormality of gait  Decreased strength, endurance, and mobility  Weakness of right lower extremity  Stiffness of joint, lower leg, right          [facility] PT Assessment - 01/27/15 0001    Assessment   Medical Diagnosis Rt TKA   Onset Date/Surgical Date 12/16/14   Hand Dominance Right   Next MD Visit 01/27/15          [facility] Adult PT Treatment/Exercise - 01/27/15 0001    Knee/Hip Exercises: Stretches   Piriformis Stretch 2 reps;30 seconds  each leg   Knee/Hip Exercises: Aerobic   Stationary Bike L3: 6 min    Knee/Hip Exercises: Machines for Strengthening   Cybex Knee Extension 2 plates with RLE only x 10; 3 plates BLE to ext, RLE eccentric control to flexion x 10    Total Gym Leg Press 5 plates with RLE x 10 reps x 2 sets   Knee/Hip Exercises: Standing   Lateral Step Up Right;1 set;Step Height: 8";10 reps;Hand Hold: 0  cross over step ups    Lunge Walking - Round Trips 40 ft x 2 trials, VC for form.    SLS Forward SLS leans to chair seat x 10 RLE (and 5 reps on LLE) - challenging   Knee/Hip Exercises: Prone   Hamstring Curl 10 reps  3# at Rt ankle   Prone Knee Hang 1 minute  3 reps   Prone Knee Hang Weights (lbs) 3   Modalities   Modalities Cryotherapy;Electrical Stimulation;Moist Heat   Moist Heat Therapy   Number Minutes Moist Heat 15 Minutes   Moist Heat Location Knee  posterior   Cryotherapy   Number Minutes Cryotherapy 15 Minutes   Cryotherapy Location Knee  Rt ant knee   Type of Cryotherapy Ice pack   Electrical Stimulation   Electrical Stimulation Location Rt knee   Electrical Stimulation Action IFC  (ion repelling machine unavailable)   Electrical Stimulation Parameters to tolerance x 15 min    Electrical Stimulation Goals Pain                PT Education - 01/27/15 1708    Education provided Yes   Education Details Added walking lunge to HEP    Person(s) Educated Patient   Methods Demonstration;Explanation   Comprehension Returned demonstration;Verbalized understanding          PT Short Term Goals -  01/10/15 0834    PT SHORT TERM GOAL #1   Title Patient I in initial HEP - 02/08/15   Time 4   Period Weeks   Status New   PT SHORT TERM GOAL #2   Title Increase ROM Rt knee extension to 0 degrees and flexion to 125 degrees 02/08/15   Time 4   Period Weeks   Status New   PT SHORT TERM GOAL #3   Title Increase strength Rt LE to 5-/5 50 5/5 02/14/15   Time 5   Period Weeks   Status New           PT Long Term Goals - 01/10/15 0836    PT LONG TERM GOAL #1   Title Patient I in discharge exercise program 03/07/15   Time 8   Period Weeks   Status New   PT LONG TERM GOAL #2   Title Full painfree ROM Rt knee 0-130 degrees 03/07/15   Time 8   Period Weeks   Status New   PT LONG TERM GOAL #3   Title 5/5 strength Rt LE 03/07/15   Time 8   Period Weeks   Status New   PT LONG TERM GOAL #4   Title Return to normal functional activity level  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  Pt continues with decreased ROM and strength in Rt knee and will benefit from continued PT intervention to maximize function.    Pt will benefit from skilled therapeutic intervention in order to improve on the following deficits Abnormal gait;Decreased range of motion;Decreased strength;Decreased activity tolerance;Decreased balance   Rehab Potential Good   PT Frequency 2x / week   PT Duration 8 weeks   PT Treatment/Interventions Patient/family education;ADLs/Self Care Home Management;Therapeutic exercise;Therapeutic activities;Neuromuscular re-education;Cryotherapy;Electrical Stimulation;Moist Heat;Ultrasound;Functional mobility training;Dry needling   PT Next Visit Plan Continue progressive ROM and strengthening to Rt knee.    Consulted and Agree with Plan of Care Patient        Problem List Patient Active Problem List   Diagnosis Date Noted  . Anal fissure 01/13/2013    Mayer Camel, PTA 01/27/2015 5:35 PM  [facility] [place] [address] [place] [address] [place], [place], [address] Phone: [phone]   Fax:  [phone]

## 2015-01-31 ENCOUNTER — Ambulatory Visit (INDEPENDENT_AMBULATORY_CARE_PROVIDER_SITE_OTHER): Payer: PRIVATE HEALTH INSURANCE | Admitting: Physical Therapy

## 2015-01-31 DIAGNOSIS — Z7409 Other reduced mobility: Secondary | ICD-10-CM | POA: Diagnosis not present

## 2015-01-31 DIAGNOSIS — M25661 Stiffness of right knee, not elsewhere classified: Secondary | ICD-10-CM

## 2015-01-31 DIAGNOSIS — R269 Unspecified abnormalities of gait and mobility: Secondary | ICD-10-CM | POA: Diagnosis not present

## 2015-01-31 DIAGNOSIS — R531 Weakness: Secondary | ICD-10-CM

## 2015-01-31 DIAGNOSIS — R29898 Other symptoms and signs involving the musculoskeletal system: Secondary | ICD-10-CM | POA: Diagnosis not present

## 2015-01-31 DIAGNOSIS — R6889 Other general symptoms and signs: Secondary | ICD-10-CM

## 2015-01-31 NOTE — Therapy (Signed)
[facility] [place] [address] [place] [address] [place], [place], [address] Phone: [phone]   Fax:  [phone]  Physical Therapy Treatment  Patient Details  Name: Derrick Terry MRN: [id number] Date of Birth: 12-Jun-1960 Referring Provider:  Tarry Kos, MD  Encounter Date: 01/31/2015      PT End of Session - 01/31/15 0934    Visit Number 7   Number of Visits 16   Date for PT Re-Evaluation 03/07/15   PT Start Time 0931   PT Stop Time 1030   PT Time Calculation (min) 59 min   Activity Tolerance Patient tolerated treatment well      Past Medical History  Diagnosis Date  . Diabetes mellitus without complication   . Hypertension   . ED (erectile dysfunction)   . Herpes labialis   . Hematuria   . Hyperlipidemia   . Diabetic retinopathy     Past Surgical History  Procedure Laterality Date  . Knee arthroscopy Left   . Knee arthroscopy Right     There were no vitals filed for this visit.  Visit Diagnosis:  Stiffness of joint, lower leg, right  Weakness of right lower extremity  Abnormality of gait  Decreased strength, endurance, and mobility      Subjective Assessment - 01/31/15 0934    Subjective Pt reports he has been working out at gym, doing stairs, pool workout. Pt feels he has more confidence in his knee. Knee is still stiff, but it is improving.    Currently in Pain? No/denies            [facility] PT Assessment - 01/31/15 0001    Assessment   Medical Diagnosis Rt TKA   Onset Date/Surgical Date 12/16/14   Hand Dominance Right   Next MD Visit 03/10/15   AROM   Right/Left Knee Right   Right Knee Extension -7   Right Knee Flexion 118   Left Knee Flexion 141   Strength   Right/Left Hip Right   Right Hip Flexion --  5-/5   Right Hip Extension --  5-/5   Right Hip ABduction 5/5   Right/Left Knee Right   Right Knee Flexion --  5-/5           [facility] Adult PT Treatment/Exercise - 01/31/15 0001    Knee/Hip Exercises: Stretches   Gastroc Stretch Right;2 reps;30 seconds   Soleus Stretch Right;60 seconds;2 reps   Knee/Hip Exercises: Aerobic   Elliptical L3: 4 min forward, 2 min backward   Knee/Hip Exercises: Machines for Strengthening   Cybex Knee Extension 2 plates with RLE x 10 reps    Total Gym Leg Press 5 plates with RLE x 10 reps, 6 plates x 10 reps   Knee/Hip Exercises: Standing   Heel Raises --  BLE raise x 10 with RLE eccentric lowering   Heel Raises Limitations x 15 reps BLE on mini-tramp   Lunge Walking - Round Trips 50 ft x 2 reps with 3# in each hand    SLS Rt SLS on mini tramp x 30 sec x 3 reps; forward leans to 13" step x 10    Knee/Hip Exercises: Supine   Straight Leg Raise with External Rotation Strengthening;Right;1 set;10 reps   Straight Leg Raise with External Rotation Limitations (easy, pt performing at home)   Knee/Hip Exercises: Prone   Hamstring Curl 10 reps;1 set  4# at ankle   Modalities   Modalities Office manager  Location Rt knee    Electrical Stimulation Action ion repelling   Electrical Stimulation Parameters to tolerance   Electrical Stimulation Goals Edema;Pain   Vasopneumatic   Number Minutes Vasopneumatic  15 minutes   Vasopnuematic Location  Knee   Vasopneumatic Pressure Medium   Vasopneumatic Temperature  32 degrees            PT Short Term Goals - 01/10/15 0834    PT SHORT TERM GOAL #1   Title Patient I in initial HEP - 02/08/15   Time 4   Period Weeks   Status New   PT SHORT TERM GOAL #2   Title Increase ROM Rt knee extension to 0 degrees and flexion to 125 degrees 02/08/15   Time 4   Period Weeks   Status New   PT SHORT TERM GOAL #3   Title Increase strength Rt LE to 5-/5 50 5/5 02/14/15   Time 5   Period Weeks   Status New           PT Long Term Goals - 01/10/15 0836    PT LONG TERM GOAL #1   Title Patient I in discharge exercise program  03/07/15   Time 8   Period Weeks   Status New   PT LONG TERM GOAL #2   Title Full painfree ROM Rt knee 0-130 degrees 03/07/15   Time 8   Period Weeks   Status New   PT LONG TERM GOAL #3   Title 5/5 strength Rt LE 03/07/15   Time 8   Period Weeks   Status New   PT LONG TERM GOAL #4   Title Return to normal functional activity level 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  Strength in RLE has improved. Pt tolerated new exercises with increased difficulty with minimal increase in pain.    Pt will benefit from skilled therapeutic intervention in order to improve on the following deficits Abnormal gait;Decreased range of motion;Decreased strength;Decreased activity tolerance;Decreased balance   Rehab Potential Good   PT Frequency 2x / week   PT Duration 8 weeks   PT Treatment/Interventions Patient/family education;ADLs/Self Care Home Management;Therapeutic exercise;Therapeutic activities;Neuromuscular re-education;Cryotherapy;Electrical Stimulation;Moist Heat;Ultrasound;Functional mobility training;Dry needling   PT Next Visit Plan Continue progressive ROM and strengthening to Rt knee.    Consulted and Agree with Plan of Care Patient        Problem List Patient Active Problem List   Diagnosis Date Noted  . Anal fissure 01/13/2013    Mayer Camel, PTA 01/31/2015 11:30 AM  [facility] [place] [address] [place] [address] [place], [place], [address] Phone: [phone]   Fax:  [phone]

## 2015-02-03 ENCOUNTER — Ambulatory Visit (INDEPENDENT_AMBULATORY_CARE_PROVIDER_SITE_OTHER): Payer: PRIVATE HEALTH INSURANCE | Admitting: Physical Therapy

## 2015-02-03 DIAGNOSIS — R29898 Other symptoms and signs involving the musculoskeletal system: Secondary | ICD-10-CM

## 2015-02-03 DIAGNOSIS — R531 Weakness: Secondary | ICD-10-CM

## 2015-02-03 DIAGNOSIS — Z7409 Other reduced mobility: Secondary | ICD-10-CM | POA: Diagnosis not present

## 2015-02-03 DIAGNOSIS — M25661 Stiffness of right knee, not elsewhere classified: Secondary | ICD-10-CM | POA: Diagnosis not present

## 2015-02-03 NOTE — Therapy (Signed)
[place], [place], [address] Phone: [phone]   Fax:  [phone]  Physical Therapy Treatment  Patient Details  Name: Derrick Terry MRN: [id number] Date of Birth: [birth year] Referring Provider:  Leandrew Koyanagi, MD  Encounter Date: 02/03/2015      PT End of Session - 02/03/15 0939    Visit Number 8   Number of Visits 16   Date for PT Re-Evaluation 03/07/15   PT Start Time 0933   PT Stop Time 1033   PT Time Calculation (min) 60 min   Activity Tolerance No increased pain      Past Medical History  Diagnosis Date  . Diabetes mellitus without complication   . Hypertension   . ED (erectile dysfunction)   . Herpes labialis   . Hematuria   . Hyperlipidemia   . Diabetic retinopathy     Past Surgical History  Procedure Laterality Date  . Knee arthroscopy Left   . Knee arthroscopy Right     There were no vitals filed for this visit.  Visit Diagnosis:  Stiffness of joint, lower leg, right  Weakness of right lower extremity  Decreased strength, endurance, and mobility      Subjective Assessment - 02/03/15 0940    Subjective Pt reports he was very sore the day after last session.  Pt feels he can climb stairs easier.    Patient Stated Goals walk increased distance (3-4 miles)   Currently in Pain? No/denies            [facility] PT Assessment - 02/03/15 0001    Assessment   Medical Diagnosis Rt TKA   Onset Date/Surgical Date 12/16/14   Hand Dominance Right   Next MD Visit 03/10/15   AROM   Right/Left Knee Right   Right Knee Extension -2   Right Knee Flexion 122  with over pressure           [facility] Adult PT Treatment/Exercise - 02/03/15 0001    Knee/Hip Exercises: Stretches   Sports administrator Right;30 seconds;2 reps   Gastroc Stretch 4 reps;Right;Left;30 seconds   Soleus Stretch Right;Left;4 reps;30 seconds   Knee/Hip Exercises: Aerobic   Elliptical L3: 6.5 mins   Knee/Hip  Exercises: Standing   Heel Raises 3 sets;Both;10 reps  toes in, out, straight   Heel Raises Limitations x 10 reps with BLE raise, lower with RLE.    Wall Squat 10 reps;5 seconds   Lunge Walking - Round Trips 80 ft x 2 reps with 3# in each hand    Other Standing Knee Exercises Forward lunges on 13" step followed by hamstring stretch x 10 reps    Other Standing Knee Exercises XTS pink band walk: backwards, R.L side stepping 5 ft each direction x 5 reps.    Modalities   Modalities Futures trader Parameters to tolerance x 15 min    Electrical Stimulation Goals Edema;Pain   Vasopneumatic   Number Minutes Vasopneumatic  15 minutes   Vasopnuematic Location  Knee   Vasopneumatic Pressure Medium   Vasopneumatic Temperature  38 deg                  PT Short Term Goals - 02/03/15 1255    PT SHORT TERM GOAL #1   Title Patient I in initial HEP - 02/08/15   Time 4  Period Weeks   Status Achieved   PT SHORT TERM GOAL #2   Title Increase ROM Rt knee extension to 0 degrees and flexion to 125 degrees 02/08/15   Time 4   Period Weeks   Status On-going   PT SHORT TERM GOAL #3   Title Increase strength Rt LE to 5-/5 50 5/5 02/14/15   Time 5   Period Weeks   Status Achieved           PT Long Term Goals - 02/03/15 1255    PT LONG TERM GOAL #1   Title Patient I in discharge exercise program 03/07/15   Time 8   Period Weeks   Status On-going   PT LONG TERM GOAL #2   Title Full painfree ROM Rt knee 0-130 degrees 03/07/15   Time 8   Period Weeks   Status On-going   PT LONG TERM GOAL #3   Title 5/5 strength Rt LE 03/07/15   Time 8   Period Weeks   Status On-going   PT LONG TERM GOAL #4   Title Return to normal functional activity level 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 Pt tolerated all new exercises without increase in pain. Progressing well towards established goals, has met STG #1 and 3.    Pt will benefit from skilled therapeutic intervention in order to improve on the following deficits Abnormal gait;Decreased range of motion;Decreased strength;Decreased activity tolerance;Decreased balance   Rehab Potential Good   PT Frequency 2x / week   PT Duration 8 weeks   PT Treatment/Interventions Patient/family education;ADLs/Self Care Home Management;Therapeutic exercise;Therapeutic activities;Neuromuscular re-education;Cryotherapy;Electrical Stimulation;Moist Heat;Ultrasound;Functional mobility training;Dry needling   PT Next Visit Plan Continue progressive ROM and strengthening to Rt knee.    Consulted and Agree with Plan of Care Patient        Problem List Patient Active Problem List   Diagnosis Date Noted  . Anal fissure 01/13/2013    Kerin Perna, PTA 02/03/2015 12:56 PM  [place], [place], [address] Phone: [phone]   Fax:  [phone]

## 2015-02-08 ENCOUNTER — Ambulatory Visit (INDEPENDENT_AMBULATORY_CARE_PROVIDER_SITE_OTHER): Payer: PRIVATE HEALTH INSURANCE | Admitting: Physical Therapy

## 2015-02-08 DIAGNOSIS — M25661 Stiffness of right knee, not elsewhere classified: Secondary | ICD-10-CM | POA: Diagnosis not present

## 2015-02-08 DIAGNOSIS — R29898 Other symptoms and signs involving the musculoskeletal system: Secondary | ICD-10-CM | POA: Diagnosis not present

## 2015-02-08 DIAGNOSIS — Z7409 Other reduced mobility: Secondary | ICD-10-CM | POA: Diagnosis not present

## 2015-02-08 DIAGNOSIS — R531 Weakness: Secondary | ICD-10-CM

## 2015-02-08 NOTE — Therapy (Signed)
[facility] [place] [address] [place] [address] [place], [place], [address] Phone: [phone]   Fax:  [phone]  Physical Therapy Treatment  Patient Details  Name: RODGERICK GILLIAND MRN: [id number] Date of Birth: 11-19-1960 Referring Provider:  [name], MD  Encounter Date: 02/08/2015      PT End of Session - 02/08/15 1138    Visit Number 9   Number of Visits 16   Date for PT Re-Evaluation 03/07/15   PT Start Time 0933   PT Stop Time 1030   PT Time Calculation (min) 57 min   Activity Tolerance No increased pain;Patient tolerated treatment well      Past Medical History  Diagnosis Date  . Diabetes mellitus without complication   . Hypertension   . ED (erectile dysfunction)   . Herpes labialis   . Hematuria   . Hyperlipidemia   . Diabetic retinopathy     Past Surgical History  Procedure Laterality Date  . Knee arthroscopy Left   . Knee arthroscopy Right     There were no vitals filed for this visit.  Visit Diagnosis:  Stiffness of joint, lower leg, right  Weakness of right lower extremity  Decreased strength, endurance, and mobility          [facility] PT Assessment - 02/08/15 0001    Assessment   Medical Diagnosis Rt TKA   Onset Date/Surgical Date 12/16/14   Hand Dominance Right   Next MD Visit 03/10/15   AROM   Right/Left Knee Right   Right Knee Extension 0  with quad set. -2 deg at rest   Right Knee Flexion 122           [facility] Adult PT Treatment/Exercise - 02/08/15 0001    Knee/Hip Exercises: Stretches   Passive Hamstring Stretch Left;2 reps;60 seconds   Gastroc Stretch 4 reps;Right;Left;30 seconds   Soleus Stretch Right;Left;4 reps;30 seconds   Other Knee/Hip Stretches Rt ITB stretch, 60 sec x 2 with strap   Knee/Hip Exercises: Aerobic   Elliptical L3: 2 min backward, 3 forward    Knee/Hip Exercises: Standing   Heel Raises Left;10 reps;2 sets   Heel Raises Limitations x 10 reps with BLE raise,  lower with RLE.    Knee/Hip Exercises: Supine   Heel Slides Right;1 set;5 reps   Heel Slides Limitations with overpressure from LLE   Single Leg Bridge Strengthening;Right;1 set;10 reps   Other Supine Knee/Hip Exercises Bridge with feet on green therapy ball x 10;  bridge with hamstring curl on green therapy ball x 10    Knee/Hip Exercises: Prone   Hamstring Curl 10 reps;1 set  5# at ankle, 2 sets   Prone Knee Hang 2 minutes  2 reps   Prone Knee Hang Weights (lbs) 5   Modalities   Modalities Electrical Stimulation;Vasopneumatic   Programme researcher, broadcasting/film/video Location Rt knee    Electrical Stimulation Action ion repelling    Electrical Stimulation Parameters to tolerance x 15 min    Electrical Stimulation Goals Edema;Pain   Vasopneumatic   Number Minutes Vasopneumatic  15 minutes   Vasopnuematic Location  Knee   Vasopneumatic Pressure Medium   Vasopneumatic Temperature  33 deg                   PT Short Term Goals - 02/03/15 1255    PT SHORT TERM GOAL #1   Title Patient I in initial HEP - 02/08/15   Time 4   Period Weeks  Status Achieved   PT SHORT TERM GOAL #2   Title Increase ROM Rt knee extension to 0 degrees and flexion to 125 degrees 02/08/15   Time 4   Period Weeks   Status On-going   PT SHORT TERM GOAL #3   Title Increase strength Rt LE to 5-/5 50 5/5 02/14/15   Time 5   Period Weeks   Status Achieved           PT Long Term Goals - 02/03/15 1255    PT LONG TERM GOAL #1   Title Patient I in discharge exercise program 03/07/15   Time 8   Period Weeks   Status On-going   PT LONG TERM GOAL #2   Title Full painfree ROM Rt knee 0-130 degrees 03/07/15   Time 8   Period Weeks   Status On-going   PT LONG TERM GOAL #3   Title 5/5 strength Rt LE 03/07/15   Time 8   Period Weeks   Status On-going   PT LONG TERM GOAL #4   Title Return to normal functional activity level 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  Gait quality (seen between exercises) improving. Pt progressing well towards goals.    Pt will benefit from skilled therapeutic intervention in order to improve on the following deficits Abnormal gait;Decreased range of motion;Decreased strength;Decreased activity tolerance;Decreased balance   Rehab Potential Good   PT Frequency 2x / week   PT Duration 8 weeks   PT Treatment/Interventions Patient/family education;ADLs/Self Care Home Management;Therapeutic exercise;Therapeutic activities;Neuromuscular re-education;Cryotherapy;Electrical Stimulation;Moist Heat;Ultrasound;Functional mobility training;Dry needling   PT Next Visit Plan Continue progressive ROM and strengthening to Rt knee.    Consulted and Agree with Plan of Care Patient       Mayer Camel, PTA 02/08/2015 11:41 AM  [facility] [place] [address] [place] [address] [place], [place], [address] Phone: [phone]   Fax:  [phone]

## 2015-02-10 ENCOUNTER — Ambulatory Visit (INDEPENDENT_AMBULATORY_CARE_PROVIDER_SITE_OTHER): Payer: PRIVATE HEALTH INSURANCE | Admitting: Physical Therapy

## 2015-02-10 DIAGNOSIS — M25661 Stiffness of right knee, not elsewhere classified: Secondary | ICD-10-CM

## 2015-02-10 DIAGNOSIS — R6889 Other general symptoms and signs: Secondary | ICD-10-CM

## 2015-02-10 DIAGNOSIS — R531 Weakness: Secondary | ICD-10-CM

## 2015-02-10 DIAGNOSIS — R29898 Other symptoms and signs involving the musculoskeletal system: Secondary | ICD-10-CM

## 2015-02-10 DIAGNOSIS — Z7409 Other reduced mobility: Secondary | ICD-10-CM

## 2015-02-10 NOTE — Therapy (Signed)
[facility] [place] [address] [place], [place], [address] Phone: [phone]   Fax:  [phone]  Physical Therapy Treatment  Patient Details  Name: Derrick Terry MRN: [id number] Date of Birth: 23-May-1961 Referring Provider:  Tarry Kos, MD  Encounter Date: 02/10/2015      PT End of Session - 02/10/15 1151    Visit Number 10   Number of Visits 16   Date for PT Re-Evaluation 03/07/15   PT Start Time 1147   PT Stop Time 1241   PT Time Calculation (min) 54 min   Activity Tolerance Patient tolerated treatment well      Past Medical History  Diagnosis Date  . Diabetes mellitus without complication   . Hypertension   . ED (erectile dysfunction)   . Herpes labialis   . Hematuria   . Hyperlipidemia   . Diabetic retinopathy     Past Surgical History  Procedure Laterality Date  . Knee arthroscopy Left   . Knee arthroscopy Right     There were no vitals filed for this visit.  Visit Diagnosis:  No diagnosis found.      Subjective Assessment - 02/10/15 1152    Subjective Pt reports that he is sore today; did a few extra exercises to increase ROM yesterday.  Pt returned to work part time.    Currently in Pain? Yes   Pain Score 3    Pain Location Knee   Pain Orientation Right   Pain Descriptors / Indicators Sore   Aggravating Factors  being still too long   Pain Relieving Factors ice/heat            [facility] Adult PT Treatment/Exercise - 02/10/15 0001    Knee/Hip Exercises: Stretches   Passive Hamstring Stretch Right;2 reps;30 seconds   Quad Stretch Right;Left;2 reps;30 seconds   Gastroc Stretch Both;2 reps;30 seconds   Soleus Stretch Both;30 seconds;2 reps   Other Knee/Hip Stretches Rt ITB stretch, 60 sec x 2 with strap;  Rt hip adductor stretch with strap x 30 sec x 2.    Knee/Hip Exercises: Aerobic   Elliptical L3: 5 min    Knee/Hip Exercises: Standing   Side Lunges Right;Left;1 set;10 reps   Side  Lunges Limitations (curtsy lunges) mirror for visual feeback.   SLS Rt SLS on Bosu x 30 sec x 3 with occasional UE support, repeated 1 rep on Bosu upside down.    Other Standing Knee Exercises Fitter with black/blue band x 10 x 4 sets   Other Standing Knee Exercises side shuffle 25 ft x 6 reps    Knee/Hip Exercises: Supine   Single Leg Bridge Strengthening;Right; Left;1 set;10 reps   Programme researcher, broadcasting/film/video Location Rt knee   Astronomer Stimulation Parameters to tolerance x 15 min    Electrical Stimulation Goals Edema;Pain   Vasopneumatic   Number Minutes Vasopneumatic  15 minutes   Vasopnuematic Location  Knee   Vasopneumatic Pressure Medium   Vasopneumatic Temperature  40            PT Short Term Goals - 02/03/15 1255    PT SHORT TERM GOAL #1   Title Patient I in initial HEP - 02/08/15   Time 4   Period Weeks   Status Achieved   PT SHORT TERM GOAL #2   Title Increase ROM Rt knee extension to 0 degrees and flexion to 125 degrees 02/08/15   Time 4  Period Weeks   Status On-going   PT SHORT TERM GOAL #3   Title Increase strength Rt LE to 5-/5 50 5/5 02/14/15   Time 5   Period Weeks   Status Achieved           PT Long Term Goals - 02/03/15 1255    PT LONG TERM GOAL #1   Title Patient I in discharge exercise program 03/07/15   Time 8   Period Weeks   Status On-going   PT LONG TERM GOAL #2   Title Full painfree ROM Rt knee 0-130 degrees 03/07/15   Time 8   Period Weeks   Status On-going   PT LONG TERM GOAL #3   Title 5/5 strength Rt LE 03/07/15   Time 8   Period Weeks   Status On-going   PT LONG TERM GOAL #4   Title Return to normal functional activity level 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  Pt reported decrease in some pain with ther ex.  Pt making good progress towards established goals.    Pt will benefit from skilled therapeutic intervention in order to improve on the following deficits Abnormal gait;Decreased range of motion;Decreased strength;Decreased activity tolerance;Decreased balance   Rehab Potential Good   PT Frequency 2x / week   PT Duration 8 weeks   PT Treatment/Interventions Patient/family education;ADLs/Self Care Home Management;Therapeutic exercise;Therapeutic activities;Neuromuscular re-education;Cryotherapy;Electrical Stimulation;Moist Heat;Ultrasound;Functional mobility training;Dry needling   PT Next Visit Plan Continue progressive ROM and strengthening to Rt knee.   Assess ROM/ Strength in RLE   Consulted and Agree with Plan of Care Patient        Problem List Patient Active Problem List   Diagnosis Date Noted  . Anal fissure 01/13/2013    Mayer Camel, PTA 02/10/2015 12:40 PM  [facility] [place] [address] [place], [place], [address] Phone: [phone]   Fax:  [phone]

## 2015-02-14 ENCOUNTER — Ambulatory Visit (INDEPENDENT_AMBULATORY_CARE_PROVIDER_SITE_OTHER): Payer: PRIVATE HEALTH INSURANCE | Admitting: Physical Therapy

## 2015-02-14 DIAGNOSIS — Z7409 Other reduced mobility: Secondary | ICD-10-CM | POA: Diagnosis not present

## 2015-02-14 DIAGNOSIS — R6889 Other general symptoms and signs: Secondary | ICD-10-CM

## 2015-02-14 DIAGNOSIS — M25661 Stiffness of right knee, not elsewhere classified: Secondary | ICD-10-CM

## 2015-02-14 DIAGNOSIS — R29898 Other symptoms and signs involving the musculoskeletal system: Secondary | ICD-10-CM

## 2015-02-14 DIAGNOSIS — R531 Weakness: Secondary | ICD-10-CM

## 2015-02-14 NOTE — Therapy (Signed)
[place], [place], [address] Phone: [phone]   Fax:  [phone]  Physical Therapy Treatment  Patient Details  Name: Derrick Terry MRN: [id number] Date of Birth: 1960/12/08 Referring Provider:  Leandrew Koyanagi, MD  Encounter Date: 02/14/2015      PT End of Session - 02/14/15 0808    Visit Number 11   Number of Visits 16   Date for PT Re-Evaluation 03/07/15   PT Start Time 0758   PT Stop Time 0901   PT Time Calculation (min) 63 min      Past Medical History  Diagnosis Date  . Diabetes mellitus without complication   . Hypertension   . ED (erectile dysfunction)   . Herpes labialis   . Hematuria   . Hyperlipidemia   . Diabetic retinopathy     Past Surgical History  Procedure Laterality Date  . Knee arthroscopy Left   . Knee arthroscopy Right     There were no vitals filed for this visit.  Visit Diagnosis:  Stiffness of joint, lower leg, right  Weakness of right lower extremity  Decreased strength, endurance, and mobility      Subjective Assessment - 02/14/15 0808    Subjective Pt reports his Rt knee aches at night; has to stretch in night to calm it down.    Currently in Pain? No/denies            [facility] PT Assessment - 02/14/15 0001    Assessment   Medical Diagnosis Rt TKA   Onset Date/Surgical Date 12/16/14   Hand Dominance Right   Next MD Visit 03/10/15   AROM   Right/Left Knee Right   Right Knee Extension 0  with quad set. -2 deg at rest   Right Knee Flexion 126  (with overpressure of LLE)   Strength   Right/Left Hip Right          [facility] Adult PT Treatment/Exercise - 02/14/15 0001    Knee/Hip Exercises: Stretches   Passive Hamstring Stretch Right;2 reps;20 seconds   Gastroc Stretch 4 reps;20 seconds;Right;Left   Soleus Stretch Right;Left;4 reps;20 seconds   Knee/Hip Exercises: Aerobic   Elliptical Forward 4 min, backward 3 min   Knee/Hip  Exercises: Standing   Heel Raises Both;3 sets;10 reps   Heel Raises Limitations (toes in, out, straight) while on mini tramp.    Lunge Walking - Round Trips 80 ft x 2 reps with 5# in each hand (with hip flexion hold 3 sec)   SLS Rt SLS on mini tramp with horizontal head turns x 30 sec x 2 reps    Other Standing Knee Exercises Fitter with 2 black band x 10 x 4 sets   Knee/Hip Exercises: Seated   Other Seated Knee/Hip Exercises seated scoots x 20 reps (seated on ball for 10 reps)    Modalities   Modalities Electrical Stimulation;Vasopneumatic   Electrical Stimulation   Electrical Stimulation Location Rt knee   Electrical Stimulation Action ion repelling   Electrical Stimulation Parameters to tolerance, x 15 min    Electrical Stimulation Goals Edema;Pain   Vasopneumatic   Number Minutes Vasopneumatic  15 minutes   Vasopnuematic Location  Knee   Vasopneumatic Pressure Medium   Manual Therapy   Manual Therapy Passive ROM;Joint mobilization   Joint Mobilization Rt patella mobs in all directions, over pressure into Rt knee extension x 30 sec x 5 reps  PT Short Term Goals - 02/14/15 1117    PT SHORT TERM GOAL #1   Title Patient I in initial HEP - 02/08/15   Time 4   Period Weeks   Status Achieved   PT SHORT TERM GOAL #2   Title Increase ROM Rt knee extension to 0 degrees and flexion to 125 degrees 02/08/15   Time 4   Period Weeks   Status Achieved   PT SHORT TERM GOAL #3   Title Increase strength Rt LE to 5-/5 50 5/5 02/14/15   Time 5   Period Weeks   Status Achieved           PT Long Term Goals - 02/03/15 1255    PT LONG TERM GOAL #1   Title Patient I in discharge exercise program 03/07/15   Time 8   Period Weeks   Status On-going   PT LONG TERM GOAL #2   Title Full painfree ROM Rt knee 0-130 degrees 03/07/15   Time 8   Period Weeks   Status On-going   PT LONG TERM GOAL #3   Title 5/5 strength Rt LE 03/07/15   Time 8   Period Weeks    Status On-going   PT LONG TERM GOAL #4   Title Return to normal functional activity level 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  Pt demo increased Rt knee ROM; has met STG #2.  Making good progress towards remaining goals.    Pt will benefit from skilled therapeutic intervention in order to improve on the following deficits Abnormal gait;Decreased range of motion;Decreased strength;Decreased activity tolerance;Decreased balance   Rehab Potential Good   PT Frequency 2x / week   PT Duration 8 weeks   PT Treatment/Interventions Patient/family education;ADLs/Self Care Home Management;Therapeutic exercise;Therapeutic activities;Neuromuscular re-education;Cryotherapy;Electrical Stimulation;Moist Heat;Ultrasound;Functional mobility training;Dry needling   PT Next Visit Plan Continue progressive ROM and strengthening to Rt knee.   Assess Strength in RLE   Consulted and Agree with Plan of Care Patient        Problem List Patient Active Problem List   Diagnosis Date Noted  . Anal fissure 01/13/2013    Kerin Perna, PTA 02/14/2015 12:49 PM  [place], [place], [address] Phone: [phone]   Fax:  [phone]

## 2015-02-17 ENCOUNTER — Ambulatory Visit (INDEPENDENT_AMBULATORY_CARE_PROVIDER_SITE_OTHER): Payer: PRIVATE HEALTH INSURANCE | Admitting: Physical Therapy

## 2015-02-17 DIAGNOSIS — Z7409 Other reduced mobility: Secondary | ICD-10-CM | POA: Diagnosis not present

## 2015-02-17 DIAGNOSIS — M25661 Stiffness of right knee, not elsewhere classified: Secondary | ICD-10-CM

## 2015-02-17 DIAGNOSIS — R29898 Other symptoms and signs involving the musculoskeletal system: Secondary | ICD-10-CM | POA: Diagnosis not present

## 2015-02-17 DIAGNOSIS — R531 Weakness: Secondary | ICD-10-CM

## 2015-02-17 NOTE — Therapy (Signed)
[facility] [place] [address] [place], [place], [address] Phone: [phone]   Fax:  [phone]  Physical Therapy Treatment  Patient Details  Name: Derrick Terry MRN: [id number] Date of Birth: 05-15-1961 Referring Provider:  Tarry Kos, MD  Encounter Date: 02/17/2015      PT End of Session - 02/17/15 1107    Visit Number 12   Number of Visits 16   Date for PT Re-Evaluation 03/07/15   PT Start Time 1103   PT Stop Time 1201   PT Time Calculation (min) 58 min      Past Medical History  Diagnosis Date  . Diabetes mellitus without complication   . Hypertension   . ED (erectile dysfunction)   . Herpes labialis   . Hematuria   . Hyperlipidemia   . Diabetic retinopathy     Past Surgical History  Procedure Laterality Date  . Knee arthroscopy Left   . Knee arthroscopy Right     There were no vitals filed for this visit.  Visit Diagnosis:  Stiffness of joint, lower leg, right  Decreased strength, endurance, and mobility  Weakness of right lower extremity      Subjective Assessment - 02/17/15 1108    Subjective Pt reports he is no longer taking sleep aid because he wasn't noticing a difference. He reports he was sore "everywhere" after last session; subsided with rest /stretching   Currently in Pain? Yes   Pain Score 1    Pain Location Knee   Pain Orientation Right   Pain Descriptors / Indicators Sore   Aggravating Factors  being still too long   Pain Relieving Factors ice/heat, exercise             [facility] PT Assessment - 02/17/15 0001    Assessment   Medical Diagnosis Rt TKA   Onset Date/Surgical Date 12/16/14   Hand Dominance Right   Next MD Visit 03/10/15   AROM   Right/Left Knee Right   Right Knee Extension 0  with quad set. -2 deg at rest   Right Knee Flexion 126  (with overpressure of LLE)           [facility] Adult PT Treatment/Exercise - 02/17/15 0001    Knee/Hip Exercises:  Stretches   Passive Hamstring Stretch Right;10 seconds  10 reps, heel on 13" stool    Quad Stretch Right;2 reps;60 seconds   Gastroc Stretch Both;2 reps;30 seconds   Soleus Stretch Both;2 reps;30 seconds   Other Knee/Hip Stretches Rt ITB stretch, 60 sec x 2 with strap, Hip adductor stretch with strap x 30 sec    Knee/Hip Exercises: Aerobic   Elliptical L3: 4 min forward, 2 min backwards   Knee/Hip Exercises: Standing   Heel Raises --  BLE raise x 10 with RLE eccentric lowering   Heel Raises Limitations Rt heel 1/2 " lower than Lt.    Side Lunges Right;Left;10 reps;2 sets   Side Lunges Limitations (curtsy lunges) mirror for visual feeback. 3 # in each hand    SLS Rt SLS on blue pad with gren ball throw at rebounder with vectors x 10 x 2; repeated with grey pad x 12  x 2 sets   Other Standing Knee Exercises Forward lunges on 13" step followed by hamstring stretch x 10 reps    Other Standing Knee Exercises lunge to knee on blue pad x 10 each side.  Fitter with 2 black bands x 20 reps x 3 sets  Modalities   Modalities Personal assistant Stimulation Parameters to tolerance x 15 min    Electrical Stimulation Goals Edema;Pain   Vasopneumatic   Number Minutes Vasopneumatic  15 minutes   Vasopnuematic Location  Knee   Vasopneumatic Pressure Medium   Vasopneumatic Temperature  3*            PT Short Term Goals - 02/14/15 1117    PT SHORT TERM GOAL #1   Title Patient I in initial HEP - 02/08/15   Time 4   Period Weeks   Status Achieved   PT SHORT TERM GOAL #2   Title Increase ROM Rt knee extension to 0 degrees and flexion to 125 degrees 02/08/15   Time 4   Period Weeks   Status Achieved   PT SHORT TERM GOAL #3   Title Increase strength Rt LE to 5-/5 50 5/5 02/14/15   Time 5   Period Weeks   Status Achieved           PT Long  Term Goals - 02/03/15 1255    PT LONG TERM GOAL #1   Title Patient I in discharge exercise program 03/07/15   Time 8   Period Weeks   Status On-going   PT LONG TERM GOAL #2   Title Full painfree ROM Rt knee 0-130 degrees 03/07/15   Time 8   Period Weeks   Status On-going   PT LONG TERM GOAL #3   Title 5/5 strength Rt LE 03/07/15   Time 8   Period Weeks   Status On-going   PT LONG TERM GOAL #4   Title Return to normal functional activity level 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  Continues to make good gains towards established goals.  Pt will benefit from continued PT intervention to maximize function.    Pt will benefit from skilled therapeutic intervention in order to improve on the following deficits Abnormal gait;Decreased range of motion;Decreased strength;Decreased activity tolerance;Decreased balance   Rehab Potential Good   PT Frequency 2x / week   PT Duration 8 weeks   PT Treatment/Interventions Patient/family education;ADLs/Self Care Home Management;Therapeutic exercise;Therapeutic activities;Neuromuscular re-education;Cryotherapy;Electrical Stimulation;Moist Heat;Ultrasound;Functional mobility training;Dry needling   PT Next Visit Plan Continue progressive ROM and strengthening to Rt knee.   Assess strength in RLE.    Consulted and Agree with Plan of Care Patient        Mayer Camel, PTA 02/17/2015 1:01 PM' [facility] [place] [address] [place], [place], [address] Phone: [phone]   Fax:  [phone]

## 2015-02-21 ENCOUNTER — Ambulatory Visit (INDEPENDENT_AMBULATORY_CARE_PROVIDER_SITE_OTHER): Payer: PRIVATE HEALTH INSURANCE | Admitting: Physical Therapy

## 2015-02-21 DIAGNOSIS — R29898 Other symptoms and signs involving the musculoskeletal system: Secondary | ICD-10-CM | POA: Diagnosis not present

## 2015-02-21 DIAGNOSIS — Z7409 Other reduced mobility: Secondary | ICD-10-CM | POA: Diagnosis not present

## 2015-02-21 DIAGNOSIS — M25661 Stiffness of right knee, not elsewhere classified: Secondary | ICD-10-CM | POA: Diagnosis not present

## 2015-02-21 DIAGNOSIS — M25662 Stiffness of left knee, not elsewhere classified: Secondary | ICD-10-CM

## 2015-02-21 DIAGNOSIS — R531 Weakness: Secondary | ICD-10-CM

## 2015-02-21 DIAGNOSIS — R269 Unspecified abnormalities of gait and mobility: Secondary | ICD-10-CM

## 2015-02-21 DIAGNOSIS — R6889 Other general symptoms and signs: Secondary | ICD-10-CM

## 2015-02-21 NOTE — Therapy (Signed)
[facility] [place] [address] [place], [place], [address] Phone: [phone]   Fax:  [phone]  Physical Therapy Treatment  Patient Details  Name: Derrick Terry MRN: [id number] Date of Birth: 06/29/1960 Referring Shakisha Abend:  Tarry Kos, MD  Encounter Date: 02/21/2015      PT End of Session - 02/21/15 1155    Visit Number 13   Number of Visits 16   Date for PT Re-Evaluation 03/07/15   PT Start Time 1148   PT Stop Time 1249   PT Time Calculation (min) 61 min   Activity Tolerance Patient tolerated treatment well      Past Medical History  Diagnosis Date  . Diabetes mellitus without complication   . Hypertension   . ED (erectile dysfunction)   . Herpes labialis   . Hematuria   . Hyperlipidemia   . Diabetic retinopathy     Past Surgical History  Procedure Laterality Date  . Knee arthroscopy Left   . Knee arthroscopy Right     There were no vitals filed for this visit.  Visit Diagnosis:  Stiffness of joint, lower leg, right  Decreased strength, endurance, and mobility  Weakness of right lower extremity  Abnormality of gait  Weakness of left lower extremity  Stiffness of joint, lower leg, left          [facility] PT Assessment - 02/21/15 0001    Assessment   Medical Diagnosis Rt TKA   Onset Date/Surgical Date 12/16/14   Hand Dominance Right   Next MD Visit 03/10/15   AROM   Right/Left Knee Right   Right Knee Extension 0  with quad set. -2 deg at rest   Right Knee Flexion 127  (with overpressure of LLE)                     [facility] Adult PT Treatment/Exercise - 02/21/15 0001    Knee/Hip Exercises: Stretches   Passive Hamstring Stretch Right;2 reps;30 seconds   Quad Stretch Right;2 reps;30 seconds   Gastroc Stretch Both;2 reps;30 seconds   Soleus Stretch Both;2 reps;30 seconds   Other Knee/Hip Stretches Rt ITB stretch, 60 sec x 2 with strap, Hip adductor stretch with strap x 30  sec    Knee/Hip Exercises: Aerobic   Elliptical L3: 4 min forward, 2 min backwards   Knee/Hip Exercises: Machines for Strengthening   Cybex Knee Extension 3 plates x 5- too easy, 4 plates x 10, 5 plates x 5 reps (challenging!)   Total Gym Leg Press 8 plates x 10 reps (challenging)   Knee/Hip Exercises: Standing   Heel Raises Right;1 set;10 reps  BLE raise x 10 with RLE eccentric lowering   SLS Forward leans to cone on 13" step x 10 reps each leg (flexing hip and knee when upright)    Other Standing Knee Exercises Forward lunges on 13" step followed by hamstring stretch x 10 reps    Other Standing Knee Exercises Fitter- hip and knee extension with 1 blue/black band x 15 reps, side to side x 40 reps with 2 black bands   Knee/Hip Exercises: Supine   Other Supine Knee/Hip Exercises Bridge with reverse hamstring curl (with legs on green therapy ball) x 10   Modalities   Modalities Electrical Stimulation;Vasopneumatic   Electrical Stimulation   Electrical Stimulation Location Rt knee   Electrical Stimulation Action  ion repelling   Electrical Stimulation Parameters to tolerance, x 15 min    Electrical Stimulation Goals  Edema;Pain   Vasopneumatic   Number Minutes Vasopneumatic  15 minutes   Vasopnuematic Location  Knee   Vasopneumatic Pressure Medium   Vasopneumatic Temperature  3*                  PT Short Term Goals - 02/14/15 1117    PT SHORT TERM GOAL #1   Title Patient I in initial HEP - 02/08/15   Time 4   Period Weeks   Status Achieved   PT SHORT TERM GOAL #2   Title Increase ROM Rt knee extension to 0 degrees and flexion to 125 degrees 02/08/15   Time 4   Period Weeks   Status Achieved   PT SHORT TERM GOAL #3   Title Increase strength Rt LE to 5-/5 50 5/5 02/14/15   Time 5   Period Weeks   Status Achieved           PT Long Term Goals - 02/03/15 1255    PT LONG TERM GOAL #1   Title Patient I in discharge exercise program 03/07/15   Time 8   Period  Weeks   Status On-going   PT LONG TERM GOAL #2   Title Full painfree ROM Rt knee 0-130 degrees 03/07/15   Time 8   Period Weeks   Status On-going   PT LONG TERM GOAL #3   Title 5/5 strength Rt LE 03/07/15   Time 8   Period Weeks   Status On-going   PT LONG TERM GOAL #4   Title Return to normal functional activity level 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  Pt able to tolerate increased resistance with Rt knee. Pt continues to make good gains towards established goals and will benefit from continued PT intervention to maximize function.    Pt will benefit from skilled therapeutic intervention in order to improve on the following deficits Abnormal gait;Decreased range of motion;Decreased strength;Decreased activity tolerance;Decreased balance   Rehab Potential Good   PT Frequency 2x / week   PT Duration 8 weeks   PT Treatment/Interventions Patient/family education;ADLs/Self Care Home Management;Therapeutic exercise;Therapeutic activities;Neuromuscular re-education;Cryotherapy;Electrical Stimulation;Moist Heat;Ultrasound;Functional mobility training;Dry needling   PT Next Visit Plan Continue progressive ROM and strengthening to Rt knee.   Assess strength in RLE.    Consulted and Agree with Plan of Care Patient       Mayer Camel, PTA 02/21/2015 12:56 PM  [facility] [place] [address] [place], [place], [address] Phone: [phone]   Fax:  [phone]

## 2015-02-24 ENCOUNTER — Ambulatory Visit (INDEPENDENT_AMBULATORY_CARE_PROVIDER_SITE_OTHER): Payer: PRIVATE HEALTH INSURANCE | Admitting: Physical Therapy

## 2015-02-24 DIAGNOSIS — M25661 Stiffness of right knee, not elsewhere classified: Secondary | ICD-10-CM

## 2015-02-24 DIAGNOSIS — Z7409 Other reduced mobility: Secondary | ICD-10-CM | POA: Diagnosis not present

## 2015-02-24 DIAGNOSIS — R29898 Other symptoms and signs involving the musculoskeletal system: Secondary | ICD-10-CM

## 2015-02-24 DIAGNOSIS — R531 Weakness: Secondary | ICD-10-CM

## 2015-02-24 NOTE — Therapy (Signed)
[facility] [address] , [place], [address] Phone: [phone]   Fax:  [phone]  Physical Therapy Treatment  Patient Details  Name: Derrick Terry MRN: [id number] Date of Birth: 06/07/1960 Referring Provider:  Xu, Naiping M, MD  Encounter Date: 02/24/2015      PT End of Session - 02/24/15 1153    Visit Number 14   Number of Visits 16   Date for PT Re-Evaluation 03/07/15   PT Start Time 1150   PT Stop Time 1255   PT Time Calculation (min) 65 min      Past Medical History  Diagnosis Date  . Diabetes mellitus without complication   . Hypertension   . ED (erectile dysfunction)   . Herpes labialis   . Hematuria   . Hyperlipidemia   . Diabetic retinopathy     Past Surgical History  Procedure Laterality Date  . Knee arthroscopy Left   . Knee arthroscopy Right     There were no vitals filed for this visit.  Visit Diagnosis:  Stiffness of joint, lower leg, right  Decreased strength, endurance, and mobility  Weakness of right lower extremity      Subjective Assessment - 02/24/15 1155    Subjective Pt reports when he is still (like right before bed) his knee pain increases.  He does some exercise and ices his knee and it calms down.    Currently in Pain? No/denies            [facility] PT Assessment - 02/24/15 0001    Assessment   Medical Diagnosis Rt TKA   Onset Date/Surgical Date 12/16/14   Hand Dominance Right   Next MD Visit 03/10/15   AROM   Right/Left Knee Right   Right Knee Extension 0   Right Knee Flexion 127   PROM   Right/Left Knee Right   Right Knee Flexion 130            [facility] Adult PT Treatment/Exercise - 02/24/15 0001    Knee/Hip Exercises: Aerobic   Elliptical L3: 3 min each direction  (6 min total)   Knee/Hip Exercises: Standing   Other Standing Knee Exercises Forward lunges on 13" step followed by hamstring stretch x 8 reps x 30 sec each direction.   Knee/Hip  Exercises: Supine   Heel Slides AAROM;Right;5 reps  for ROM   Straight Leg Raise with External Rotation Right;1 set;10 reps  2 sets, with hip abd/add (long sitting)    Knee/Hip Exercises: Prone   Hamstring Curl 10 reps   Prone Knee Hang --  8 min during US. (1 min rest in middle of treatment)   Modalities   Modalities Electrical Stimulation;Vasopneumatic;Ultrasound   Electrical Stimulation   Electrical Stimulation Location Rt knee   Electrical Stimulation Action ion repelling    Electrical Stimulation Parameters to tolerance    Electrical Stimulation Goals Edema;Pain   Ultrasound   Ultrasound Location Rt hamstring (mid-distal)   Ultrasound Parameters 100%, 1.2 w/cm2 x 8 min    Ultrasound Goals Pain  increase tissue extensibilty   Vasopneumatic   Number Minutes Vasopneumatic  15 minutes   Vasopnuematic Location  Knee   Vasopneumatic Pressure Medium   Vasopneumatic Temperature  3*    Manual Therapy   Manual Therapy Soft tissue mobilization;Joint mobilization   Joint Mobilization Rt patella mobs in all directions,    Soft tissue mobilization muscle stripping to Rt hamstring; noted adhesions in distal medial and proximal lateral                     PT Short Term Goals - 02/14/15 1117    PT SHORT TERM GOAL #1   Title Patient I in initial HEP - 02/08/15   Time 4   Period Weeks   Status Achieved   PT SHORT TERM GOAL #2   Title Increase ROM Rt knee extension to 0 degrees and flexion to 125 degrees 02/08/15   Time 4   Period Weeks   Status Achieved   PT SHORT TERM GOAL #3   Title Increase strength Rt LE to 5-/5 50 5/5 02/14/15   Time 5   Period Weeks   Status Achieved           PT Long Term Goals - 02/24/15 1243    PT LONG TERM GOAL #1   Title Patient I in discharge exercise program 03/07/15   Time 8   Period Weeks   Status On-going   PT LONG TERM GOAL #2   Title Full painfree ROM Rt knee 0-130 degrees 03/07/15   Time 8   Period Weeks   Status Partially  Met   PT LONG TERM GOAL #3   Title 5/5 strength Rt LE 03/07/15   Time 8   Period Weeks   Status On-going   PT LONG TERM GOAL #4   Title Return to normal functional activity level 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 Pt was tight and slightly tender with manual to Rt hamstring.  Pt close to meeting remaining goals.    Pt will benefit from skilled therapeutic intervention in order to improve on the following deficits Abnormal gait;Decreased range of motion;Decreased strength;Decreased activity tolerance;Decreased balance   Rehab Potential Good   PT Frequency 2x / week   PT Duration 8 weeks   PT Treatment/Interventions Patient/family education;ADLs/Self Care Home Management;Therapeutic exercise;Therapeutic activities;Neuromuscular re-education;Cryotherapy;Electrical Stimulation;Moist Heat;Ultrasound;Functional mobility training;Dry needling   PT Next Visit Plan Continue progressive ROM and strengthening to Rt knee.   Assess strength in RLE.    Consulted and Agree with Plan of Care Patient        Problem List Patient Active Problem List   Diagnosis Date Noted  . Anal fissure 01/13/2013    Kerin Perna, PTA 02/24/2015 1:10 PM  [facility] [place], [place], [address] Phone: [phone]   Fax:  [phone]

## 2015-02-28 ENCOUNTER — Encounter: Payer: PRIVATE HEALTH INSURANCE | Admitting: Rehabilitative and Restorative Service Providers"

## 2015-03-02 ENCOUNTER — Ambulatory Visit (INDEPENDENT_AMBULATORY_CARE_PROVIDER_SITE_OTHER): Payer: PRIVATE HEALTH INSURANCE | Admitting: Physical Therapy

## 2015-03-02 DIAGNOSIS — Z7409 Other reduced mobility: Secondary | ICD-10-CM

## 2015-03-02 DIAGNOSIS — R29898 Other symptoms and signs involving the musculoskeletal system: Secondary | ICD-10-CM

## 2015-03-02 DIAGNOSIS — R531 Weakness: Secondary | ICD-10-CM

## 2015-03-02 DIAGNOSIS — M25661 Stiffness of right knee, not elsewhere classified: Secondary | ICD-10-CM

## 2015-03-02 NOTE — Therapy (Signed)
[place] [facility] [place], [place], [address] Phone: [phone]   Fax:  [phone]  Physical Therapy Treatment  Patient Details  Name: Derrick Terry MRN: [id number] Date of Birth: 04-18-1961 Referring Provider:  Leandrew Koyanagi, MD  Encounter Date: 03/02/2015      PT End of Session - 03/02/15 1244    Visit Number 15   Number of Visits 16   Date for PT Re-Evaluation 03/07/15   PT Start Time 1150   PT Stop Time 1252   PT Time Calculation (min) 62 min      Past Medical History  Diagnosis Date  . Diabetes mellitus without complication   . Hypertension   . ED (erectile dysfunction)   . Herpes labialis   . Hematuria   . Hyperlipidemia   . Diabetic retinopathy     Past Surgical History  Procedure Laterality Date  . Knee arthroscopy Left   . Knee arthroscopy Right     There were no vitals filed for this visit.  Visit Diagnosis:  Stiffness of joint, lower leg, right  Decreased strength, endurance, and mobility  Weakness of right lower extremity          [facility] PT Assessment - 03/02/15 0001    Assessment   Medical Diagnosis Rt TKA   Onset Date/Surgical Date 12/16/14   Hand Dominance Right   Next MD Visit 03/10/15   AROM   Right/Left Knee Right   Right Knee Extension 0   Right Knee Flexion 130  pain at end range   Strength   Right/Left Hip Right   Right Hip Flexion 5/5   Right Hip Extension --  5-/5   Right Hip ABduction 5/5   Right Hip ADduction --  5-/5   Right Knee Flexion --  5-/5   Right Knee Extension 5/5           [facility] Adult PT Treatment/Exercise - 03/02/15 0001    Knee/Hip Exercises: Stretches   Sports administrator Right;2 reps;60 seconds  prone with strap   Gastroc Stretch Both;4 reps;30 seconds   Soleus Stretch Both;4 reps;30 seconds   Knee/Hip Exercises: Aerobic   Elliptical L3: 5 min total- alternating forward/backward every other minute.   Tread Mill 3.0 mph x 4  min, slow jog @ 4.0 mph x 30 sec.    Knee/Hip Exercises: Standing   Heel Raises Right;2 sets;10 reps   Side Lunges Right;Left;10 reps;2 sets   Side Lunges Limitations (curtsy lunges) mirror for visual feeback. 3 # in each hand   4# in each hand   SLS forward leans to floor (Rt SLS) followed by hip / knee flexion x 5 reps (challenging)   Other Standing Knee Exercises Forward lunges on 13" step followed by hamstring stretch x 8 reps x 30 sec each direction.   Modalities   Modalities Vasopneumatic   Vasopneumatic   Number Minutes Vasopneumatic  15 minutes   Vasopnuematic Location  Knee   Vasopneumatic Pressure Medium   Vasopneumatic Temperature  3*   Manual Therapy   Manual Therapy Other (comment)   Manual therapy comments Rock tape applied to Rt knee in basket weave to ant portion to decrease swelling, decompressive strip applied to distal Rt ITB for pain relief.    Other Manual Therapy Manual therapy with edge tool assistance to increase tissue extensibility, ROM, and decrease pain: applied to Rt med/lateral fat pad, Rt  distal to proximal quad/ distal ITB.  PT Short Term Goals - 02/14/15 1117    PT SHORT TERM GOAL #1   Title Patient I in initial HEP - 02/08/15   Time 4   Period Weeks   Status Achieved   PT SHORT TERM GOAL #2   Title Increase ROM Rt knee extension to 0 degrees and flexion to 125 degrees 02/08/15   Time 4   Period Weeks   Status Achieved   PT SHORT TERM GOAL #3   Title Increase strength Rt LE to 5-/5 50 5/5 02/14/15   Time 5   Period Weeks   Status Achieved           PT Long Term Goals - 03/02/15 1250    PT LONG TERM GOAL #1   Title Patient I in discharge exercise program 03/07/15   Time 8   Period Weeks   Status On-going   PT LONG TERM GOAL #2   Title Full painfree ROM Rt knee 0-130 degrees 03/07/15   Time 8   Period Weeks   Status Partially Met  (painful at end range)   PT LONG TERM GOAL #3   Title 5/5 strength Rt  LE 03/07/15   Time 8   Period Weeks   Status On-going  Rt hamstring 5-/5   PT LONG TERM GOAL #4   Title Return 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  Pt tolerated all exercises with minimal increase in pain.  Near meeting goals.    Pt will benefit from skilled therapeutic intervention in order to improve on the following deficits Abnormal gait;Decreased range of motion;Decreased strength;Decreased activity tolerance;Decreased balance   Rehab Potential Good   PT Frequency 2x / week   PT Duration 8 weeks   PT Treatment/Interventions Patient/family education;ADLs/Self Care Home Management;Therapeutic exercise;Therapeutic activities;Neuromuscular re-education;Cryotherapy;Electrical Stimulation;Moist Heat;Ultrasound;Functional mobility training;Dry needling   PT Next Visit Plan Assess response to tape to Rt knee (for decreased swelling/sensitivity). Assess need for further therapy vs d/c to HEP.  Complete FOTO   Consulted and Agree with Plan of Care Patient       Kerin Perna, PTA 03/02/2015 1:05 PM  [place] [facility], [place], [address] Phone: [phone]   Fax:  [phone]

## 2015-03-03 ENCOUNTER — Encounter: Payer: PRIVATE HEALTH INSURANCE | Admitting: Physical Therapy

## 2015-03-08 ENCOUNTER — Ambulatory Visit (INDEPENDENT_AMBULATORY_CARE_PROVIDER_SITE_OTHER): Payer: PRIVATE HEALTH INSURANCE | Admitting: Physical Therapy

## 2015-03-08 DIAGNOSIS — R29898 Other symptoms and signs involving the musculoskeletal system: Secondary | ICD-10-CM | POA: Diagnosis not present

## 2015-03-08 DIAGNOSIS — M25661 Stiffness of right knee, not elsewhere classified: Secondary | ICD-10-CM | POA: Diagnosis not present

## 2015-03-08 DIAGNOSIS — R531 Weakness: Secondary | ICD-10-CM

## 2015-03-08 DIAGNOSIS — Z7409 Other reduced mobility: Secondary | ICD-10-CM

## 2015-03-08 DIAGNOSIS — R6889 Other general symptoms and signs: Secondary | ICD-10-CM

## 2015-03-08 NOTE — Therapy (Addendum)
[place], [place], [address] Phone: [phone]   Fax:  [phone]  Physical Therapy Treatment  Patient Details  Name: Derrick Terry MRN: [id number] Date of Birth: 05/08/1961 Referring Provider:  Leandrew Koyanagi, MD  Encounter Date: 03/08/2015      PT End of Session - 03/08/15 1156    Visit Number 16   Number of Visits 16   PT Start Time 0881   PT Stop Time 1250   PT Time Calculation (min) 65 min   Activity Tolerance Patient tolerated treatment well      Past Medical History  Diagnosis Date  . Diabetes mellitus without complication   . Hypertension   . ED (erectile dysfunction)   . Herpes labialis   . Hematuria   . Hyperlipidemia   . Diabetic retinopathy     Past Surgical History  Procedure Laterality Date  . Knee arthroscopy Left   . Knee arthroscopy Right     There were no vitals filed for this visit.  Visit Diagnosis:  Stiffness of joint, lower leg, right  Decreased strength, endurance, and mobility  Weakness of right lower extremity          [facility] PT Assessment - 03/08/15 0001    Assessment   Medical Diagnosis Rt TKA   Onset Date/Surgical Date 12/16/14   Hand Dominance Right   Next MD Visit 03/10/15   Observation/Other Assessments   Focus on Therapeutic Outcomes (FOTO)  34% limited.    AROM   Right/Left Knee Right   Right Knee Extension 0   Right Knee Flexion 130   Strength   Right/Left Hip Right   Right Hip Flexion 5/5   Right Hip Extension 5/5   Right Hip ABduction 5/5   Right Hip ADduction 5/5   Right Knee Flexion 5/5   Right Knee Extension 5/5             [facility] Adult PT Treatment/Exercise - 03/08/15 0001    Knee/Hip Exercises: Stretches   Passive Hamstring Stretch Right;3 reps;30 seconds   Gastroc Stretch Both;4 reps;30 seconds   Soleus Stretch Both;4 reps;30 seconds   Knee/Hip Exercises: Aerobic   Elliptical L3: 5 min total- alternating  forward/backward every other minute.   Knee/Hip Exercises: Standing   Heel Raises Both;4 sets   Forward Step Up 1 set;Hand Hold: 1;Right;Left  13" step with controlled descent   Functional Squat 2 sets;10 reps   Functional Squat Limitations with blue band around thighs (slight abd)    Other Standing Knee Exercises Single leg squat to black mat table with controlled descent x 5 reps (challenging.    Other Standing Knee Exercises Side stepping with blue band around thighs x 20 feet Rt/ Lt.    Modalities   Modalities Vasopneumatic   Vasopneumatic   Number Minutes Vasopneumatic  15 minutes   Vasopnuematic Location  Knee   Vasopneumatic Pressure Medium   Vasopneumatic Temperature  3*   Manual Therapy   Manual Therapy Other (comment)   Manual therapy comments Rock tape applied to Rt knee in basket weave to ant portion to decrease swelling, decompressive strip applied to distal Rt ITB for pain relief.    Other Manual Therapy Manual therapy with edge tool assistance to increase tissue extensibility, ROM, and decrease pain: applied to Rt med/lateral fat pad, Rt  distal to proximal quad/ distal ITB.  PT Short Term Goals - 02/14/15 1117    PT SHORT TERM GOAL #1   Title Patient I in initial HEP - 02/08/15   Time 4   Period Weeks   Status Achieved   PT SHORT TERM GOAL #2   Title Increase ROM Rt knee extension to 0 degrees and flexion to 125 degrees 02/08/15   Time 4   Period Weeks   Status Achieved   PT SHORT TERM GOAL #3   Title Increase strength Rt LE to 5-/5 50 5/5 02/14/15   Time 5   Period Weeks   Status Achieved           PT Long Term Goals - 03/08/15 1215    PT LONG TERM GOAL #1   Title Patient I in discharge exercise program 03/07/15   Time 8   Period Weeks   Status Achieved   PT LONG TERM GOAL #2   Title Full painfree ROM Rt knee 0-130 degrees 03/07/15   Time 8   Period Weeks   Status Achieved   PT LONG TERM GOAL #3   Title 5/5 strength  Rt LE 03/07/15   Time 8   Period Weeks   Status Achieved   PT LONG TERM GOAL #4   Title 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 Pt demonstrated Rt knee ROM 0-130 degrees with less pain this visit than last.  Pt satisfied with current level of function and is agreeable to d/c to HEP.    Pt will benefit from skilled therapeutic intervention in order to improve on the following deficits Abnormal gait;Decreased range of motion;Decreased strength;Decreased activity tolerance;Decreased balance   Rehab Potential Good   PT Frequency 2x / week   PT Duration 8 weeks   PT Treatment/Interventions Patient/family education;ADLs/Self Care Home Management;Therapeutic exercise;Therapeutic activities;Neuromuscular re-education;Cryotherapy;Electrical Stimulation;Moist Heat;Ultrasound;Functional mobility training;Dry needling   PT Next Visit Plan Spoke to supervising PT regarding pt's progress and desire to d/c .    Consulted and Agree with Plan of Care Patient       Kerin Perna, PTA 03/08/2015 12:59 PM  [place], [place], [address] Phone: [phone]   Fax:  [phone]     PHYSICAL THERAPY DISCHARGE SUMMARY  Visits from Start of Care: 16  Current functional level related to goals / functional outcomes: Excellent progress; goals accomplished; patient I in HEP   Remaining deficits: Needs to continue with HEP   Education / Equipment: HEP/theraband  Plan: Patient agrees to discharge.  Patient goals were met. Patient is being discharged due to meeting the stated rehab goals.  ?????     Celyn P. Helene Kelp PT, MPH 03/09/2015 6:57 AM

## 2015-11-23 ENCOUNTER — Other Ambulatory Visit: Payer: Self-pay | Admitting: Family Medicine

## 2015-11-23 DIAGNOSIS — M542 Cervicalgia: Secondary | ICD-10-CM

## 2015-11-30 ENCOUNTER — Ambulatory Visit
Admission: RE | Admit: 2015-11-30 | Discharge: 2015-11-30 | Disposition: A | Discharge status: Home / Self Care | Payer: PRIVATE HEALTH INSURANCE | Source: Ambulatory Visit | Attending: Family Medicine | Admitting: Family Medicine

## 2015-11-30 DIAGNOSIS — M542 Cervicalgia: Secondary | ICD-10-CM

## 2015-12-16 ENCOUNTER — Other Ambulatory Visit: Payer: Self-pay | Admitting: Neurological Surgery

## 2015-12-16 DIAGNOSIS — M47812 Spondylosis without myelopathy or radiculopathy, cervical region: Secondary | ICD-10-CM

## 2015-12-20 ENCOUNTER — Other Ambulatory Visit: Payer: PRIVATE HEALTH INSURANCE

## 2015-12-26 ENCOUNTER — Ambulatory Visit
Admission: RE | Admit: 2015-12-26 | Discharge: 2015-12-26 | Disposition: A | Discharge status: Home / Self Care | Payer: PRIVATE HEALTH INSURANCE | Source: Ambulatory Visit | Attending: Neurological Surgery | Admitting: Neurological Surgery

## 2015-12-26 DIAGNOSIS — M47812 Spondylosis without myelopathy or radiculopathy, cervical region: Secondary | ICD-10-CM

## 2016-04-13 ENCOUNTER — Ambulatory Visit
Admission: RE | Admit: 2016-04-13 | Discharge: 2016-04-13 | Disposition: A | Discharge status: Home / Self Care | Payer: PRIVATE HEALTH INSURANCE | Source: Ambulatory Visit | Attending: Neurological Surgery | Admitting: Neurological Surgery

## 2016-04-13 ENCOUNTER — Other Ambulatory Visit: Payer: Self-pay | Admitting: Neurological Surgery

## 2016-04-13 DIAGNOSIS — Z9889 Other specified postprocedural states: Secondary | ICD-10-CM

## 2016-06-15 ENCOUNTER — Ambulatory Visit
Admission: RE | Admit: 2016-06-15 | Discharge: 2016-06-15 | Disposition: A | Discharge status: Home / Self Care | Payer: PRIVATE HEALTH INSURANCE | Source: Ambulatory Visit | Attending: Neurological Surgery | Admitting: Neurological Surgery

## 2016-06-15 ENCOUNTER — Other Ambulatory Visit: Payer: Self-pay | Admitting: Neurological Surgery

## 2016-06-15 DIAGNOSIS — Z09 Encounter for follow-up examination after completed treatment for conditions other than malignant neoplasm: Secondary | ICD-10-CM

## 2016-06-15 DIAGNOSIS — M47812 Spondylosis without myelopathy or radiculopathy, cervical region: Secondary | ICD-10-CM

## 2016-07-20 ENCOUNTER — Ambulatory Visit
Admission: RE | Admit: 2016-07-20 | Discharge: 2016-07-20 | Disposition: A | Discharge status: Home / Self Care | Payer: PRIVATE HEALTH INSURANCE | Source: Ambulatory Visit | Attending: Neurological Surgery | Admitting: Neurological Surgery

## 2016-07-20 DIAGNOSIS — M47812 Spondylosis without myelopathy or radiculopathy, cervical region: Secondary | ICD-10-CM

## 2017-11-26 ENCOUNTER — Other Ambulatory Visit: Payer: Self-pay | Admitting: Orthopedic Surgery

## 2017-11-26 DIAGNOSIS — M25552 Pain in left hip: Secondary | ICD-10-CM

## 2017-12-04 ENCOUNTER — Ambulatory Visit
Admission: RE | Admit: 2017-12-04 | Discharge: 2017-12-04 | Disposition: A | Discharge status: Home / Self Care | Payer: 59 | Source: Ambulatory Visit | Attending: Orthopedic Surgery | Admitting: Orthopedic Surgery

## 2017-12-04 DIAGNOSIS — M25552 Pain in left hip: Secondary | ICD-10-CM

## 2017-12-04 MED ORDER — IOPAMIDOL (ISOVUE-M 200) INJECTION 41%
10.0000 mL | Freq: Once | INTRAMUSCULAR | Status: AC
  Administered 2017-12-04: 10 mL via INTRA_ARTICULAR

## 2017-12-04 MED ORDER — METHYLPREDNISOLONE ACETATE 40 MG/ML INJ SUSP (RADIOLOG
120.0000 mg | Freq: Once | INTRAMUSCULAR | Status: AC
  Administered 2017-12-04: 120 mg via INTRA_ARTICULAR

## 2018-04-07 DIAGNOSIS — M25562 Pain in left knee: Secondary | ICD-10-CM | POA: Diagnosis not present

## 2018-04-08 DIAGNOSIS — E113293 Type 2 diabetes mellitus with mild nonproliferative diabetic retinopathy without macular edema, bilateral: Secondary | ICD-10-CM | POA: Diagnosis not present

## 2018-04-08 DIAGNOSIS — H25813 Combined forms of age-related cataract, bilateral: Secondary | ICD-10-CM | POA: Diagnosis not present

## 2018-04-08 DIAGNOSIS — H40013 Open angle with borderline findings, low risk, bilateral: Secondary | ICD-10-CM | POA: Diagnosis not present

## 2018-05-08 DIAGNOSIS — N5201 Erectile dysfunction due to arterial insufficiency: Secondary | ICD-10-CM | POA: Diagnosis not present

## 2018-06-17 DIAGNOSIS — E119 Type 2 diabetes mellitus without complications: Secondary | ICD-10-CM | POA: Diagnosis not present

## 2018-06-17 DIAGNOSIS — Z832 Family history of diseases of the blood and blood-forming organs and certain disorders involving the immune mechanism: Secondary | ICD-10-CM | POA: Diagnosis not present

## 2018-06-17 DIAGNOSIS — D539 Nutritional anemia, unspecified: Secondary | ICD-10-CM | POA: Diagnosis not present

## 2018-06-17 DIAGNOSIS — Z5181 Encounter for therapeutic drug level monitoring: Secondary | ICD-10-CM | POA: Diagnosis not present

## 2018-06-17 DIAGNOSIS — E785 Hyperlipidemia, unspecified: Secondary | ICD-10-CM | POA: Diagnosis not present

## 2018-08-29 DIAGNOSIS — B001 Herpesviral vesicular dermatitis: Secondary | ICD-10-CM | POA: Diagnosis not present

## 2018-08-29 DIAGNOSIS — N529 Male erectile dysfunction, unspecified: Secondary | ICD-10-CM | POA: Diagnosis not present

## 2018-08-29 DIAGNOSIS — I10 Essential (primary) hypertension: Secondary | ICD-10-CM | POA: Diagnosis not present

## 2018-08-29 DIAGNOSIS — E7800 Pure hypercholesterolemia, unspecified: Secondary | ICD-10-CM | POA: Diagnosis not present

## 2018-09-18 DIAGNOSIS — Z5181 Encounter for therapeutic drug level monitoring: Secondary | ICD-10-CM | POA: Diagnosis not present

## 2018-09-18 DIAGNOSIS — Z832 Family history of diseases of the blood and blood-forming organs and certain disorders involving the immune mechanism: Secondary | ICD-10-CM | POA: Diagnosis not present

## 2018-09-18 DIAGNOSIS — E785 Hyperlipidemia, unspecified: Secondary | ICD-10-CM | POA: Diagnosis not present

## 2018-09-18 DIAGNOSIS — E119 Type 2 diabetes mellitus without complications: Secondary | ICD-10-CM | POA: Diagnosis not present

## 2018-10-21 ENCOUNTER — Emergency Department (HOSPITAL_BASED_OUTPATIENT_CLINIC_OR_DEPARTMENT_OTHER): Payer: BLUE CROSS/BLUE SHIELD

## 2018-10-21 ENCOUNTER — Encounter (HOSPITAL_BASED_OUTPATIENT_CLINIC_OR_DEPARTMENT_OTHER): Payer: Self-pay | Admitting: *Deleted

## 2018-10-21 ENCOUNTER — Emergency Department (HOSPITAL_BASED_OUTPATIENT_CLINIC_OR_DEPARTMENT_OTHER)
Admission: EM | Admit: 2018-10-21 | Discharge: 2018-10-21 | Disposition: A | Discharge status: Home / Self Care | Payer: BLUE CROSS/BLUE SHIELD | Attending: Emergency Medicine | Admitting: Emergency Medicine

## 2018-10-21 ENCOUNTER — Other Ambulatory Visit: Payer: Self-pay

## 2018-10-21 DIAGNOSIS — I10 Essential (primary) hypertension: Secondary | ICD-10-CM | POA: Insufficient documentation

## 2018-10-21 DIAGNOSIS — S0990XA Unspecified injury of head, initial encounter: Secondary | ICD-10-CM | POA: Diagnosis not present

## 2018-10-21 DIAGNOSIS — Y998 Other external cause status: Secondary | ICD-10-CM | POA: Diagnosis not present

## 2018-10-21 DIAGNOSIS — S199XXA Unspecified injury of neck, initial encounter: Secondary | ICD-10-CM | POA: Diagnosis not present

## 2018-10-21 DIAGNOSIS — Z79899 Other long term (current) drug therapy: Secondary | ICD-10-CM | POA: Insufficient documentation

## 2018-10-21 DIAGNOSIS — Z7984 Long term (current) use of oral hypoglycemic drugs: Secondary | ICD-10-CM | POA: Diagnosis not present

## 2018-10-21 DIAGNOSIS — E119 Type 2 diabetes mellitus without complications: Secondary | ICD-10-CM | POA: Insufficient documentation

## 2018-10-21 DIAGNOSIS — Y9389 Activity, other specified: Secondary | ICD-10-CM | POA: Insufficient documentation

## 2018-10-21 DIAGNOSIS — S161XXA Strain of muscle, fascia and tendon at neck level, initial encounter: Secondary | ICD-10-CM | POA: Diagnosis not present

## 2018-10-21 DIAGNOSIS — Y92019 Unspecified place in single-family (private) house as the place of occurrence of the external cause: Secondary | ICD-10-CM | POA: Diagnosis not present

## 2018-10-21 DIAGNOSIS — R51 Headache: Secondary | ICD-10-CM | POA: Diagnosis not present

## 2018-10-21 MED ORDER — ACETAMINOPHEN 500 MG PO TABS
1000.0000 mg | ORAL_TABLET | Freq: Once | ORAL | Status: AC
  Administered 2018-10-21: 1000 mg via ORAL
  Filled 2018-10-21: qty 2

## 2018-10-21 NOTE — ED Notes (Signed)
ED Provider at bedside. 
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
## 2018-10-23 DIAGNOSIS — M25511 Pain in right shoulder: Secondary | ICD-10-CM | POA: Diagnosis not present

## 2018-11-03 DIAGNOSIS — S43421D Sprain of right rotator cuff capsule, subsequent encounter: Secondary | ICD-10-CM | POA: Diagnosis not present

## 2018-11-03 DIAGNOSIS — M7541 Impingement syndrome of right shoulder: Secondary | ICD-10-CM | POA: Diagnosis not present

## 2018-11-03 DIAGNOSIS — M6281 Muscle weakness (generalized): Secondary | ICD-10-CM | POA: Diagnosis not present

## 2018-11-03 DIAGNOSIS — M25611 Stiffness of right shoulder, not elsewhere classified: Secondary | ICD-10-CM | POA: Diagnosis not present

## 2018-11-05 DIAGNOSIS — M6281 Muscle weakness (generalized): Secondary | ICD-10-CM | POA: Diagnosis not present

## 2018-11-05 DIAGNOSIS — M7541 Impingement syndrome of right shoulder: Secondary | ICD-10-CM | POA: Diagnosis not present

## 2018-11-05 DIAGNOSIS — M25611 Stiffness of right shoulder, not elsewhere classified: Secondary | ICD-10-CM | POA: Diagnosis not present

## 2018-11-05 DIAGNOSIS — S43421D Sprain of right rotator cuff capsule, subsequent encounter: Secondary | ICD-10-CM | POA: Diagnosis not present

## 2018-11-07 DIAGNOSIS — N5201 Erectile dysfunction due to arterial insufficiency: Secondary | ICD-10-CM | POA: Diagnosis not present

## 2018-11-07 DIAGNOSIS — Z3009 Encounter for other general counseling and advice on contraception: Secondary | ICD-10-CM | POA: Diagnosis not present

## 2018-11-10 DIAGNOSIS — F430 Acute stress reaction: Secondary | ICD-10-CM | POA: Diagnosis not present

## 2018-11-11 DIAGNOSIS — M6281 Muscle weakness (generalized): Secondary | ICD-10-CM | POA: Diagnosis not present

## 2018-11-11 DIAGNOSIS — M7541 Impingement syndrome of right shoulder: Secondary | ICD-10-CM | POA: Diagnosis not present

## 2018-11-11 DIAGNOSIS — S43421D Sprain of right rotator cuff capsule, subsequent encounter: Secondary | ICD-10-CM | POA: Diagnosis not present

## 2018-11-11 DIAGNOSIS — M25611 Stiffness of right shoulder, not elsewhere classified: Secondary | ICD-10-CM | POA: Diagnosis not present

## 2018-11-13 DIAGNOSIS — M25611 Stiffness of right shoulder, not elsewhere classified: Secondary | ICD-10-CM | POA: Diagnosis not present

## 2018-11-13 DIAGNOSIS — M7541 Impingement syndrome of right shoulder: Secondary | ICD-10-CM | POA: Diagnosis not present

## 2018-11-13 DIAGNOSIS — S43421D Sprain of right rotator cuff capsule, subsequent encounter: Secondary | ICD-10-CM | POA: Diagnosis not present

## 2018-11-13 DIAGNOSIS — M6281 Muscle weakness (generalized): Secondary | ICD-10-CM | POA: Diagnosis not present

## 2018-11-17 DIAGNOSIS — M6281 Muscle weakness (generalized): Secondary | ICD-10-CM | POA: Diagnosis not present

## 2018-11-17 DIAGNOSIS — M7541 Impingement syndrome of right shoulder: Secondary | ICD-10-CM | POA: Diagnosis not present

## 2018-11-17 DIAGNOSIS — S43421D Sprain of right rotator cuff capsule, subsequent encounter: Secondary | ICD-10-CM | POA: Diagnosis not present

## 2018-11-17 DIAGNOSIS — M25611 Stiffness of right shoulder, not elsewhere classified: Secondary | ICD-10-CM | POA: Diagnosis not present

## 2018-11-21 DIAGNOSIS — M6281 Muscle weakness (generalized): Secondary | ICD-10-CM | POA: Diagnosis not present

## 2018-11-21 DIAGNOSIS — M7541 Impingement syndrome of right shoulder: Secondary | ICD-10-CM | POA: Diagnosis not present

## 2018-11-21 DIAGNOSIS — M25611 Stiffness of right shoulder, not elsewhere classified: Secondary | ICD-10-CM | POA: Diagnosis not present

## 2018-11-21 DIAGNOSIS — S43421D Sprain of right rotator cuff capsule, subsequent encounter: Secondary | ICD-10-CM | POA: Diagnosis not present

## 2018-11-24 ENCOUNTER — Other Ambulatory Visit: Payer: Self-pay | Admitting: Orthopedic Surgery

## 2018-11-24 DIAGNOSIS — M25512 Pain in left shoulder: Secondary | ICD-10-CM

## 2018-11-26 ENCOUNTER — Other Ambulatory Visit: Payer: Self-pay | Admitting: Physician Assistant

## 2018-11-26 DIAGNOSIS — M25511 Pain in right shoulder: Secondary | ICD-10-CM

## 2018-11-28 ENCOUNTER — Other Ambulatory Visit: Payer: Self-pay | Admitting: Orthopedic Surgery

## 2018-11-28 DIAGNOSIS — M25511 Pain in right shoulder: Secondary | ICD-10-CM

## 2018-11-30 ENCOUNTER — Ambulatory Visit (INDEPENDENT_AMBULATORY_CARE_PROVIDER_SITE_OTHER): Payer: BC Managed Care – PPO

## 2018-11-30 ENCOUNTER — Other Ambulatory Visit: Payer: Self-pay

## 2018-11-30 DIAGNOSIS — M25511 Pain in right shoulder: Secondary | ICD-10-CM | POA: Diagnosis not present

## 2018-11-30 DIAGNOSIS — M19011 Primary osteoarthritis, right shoulder: Secondary | ICD-10-CM | POA: Diagnosis not present

## 2018-12-12 DIAGNOSIS — Z6827 Body mass index (BMI) 27.0-27.9, adult: Secondary | ICD-10-CM | POA: Diagnosis not present

## 2018-12-12 DIAGNOSIS — Z832 Family history of diseases of the blood and blood-forming organs and certain disorders involving the immune mechanism: Secondary | ICD-10-CM | POA: Diagnosis not present

## 2018-12-12 DIAGNOSIS — D539 Nutritional anemia, unspecified: Secondary | ICD-10-CM | POA: Diagnosis not present

## 2018-12-12 DIAGNOSIS — E1165 Type 2 diabetes mellitus with hyperglycemia: Secondary | ICD-10-CM | POA: Diagnosis not present

## 2018-12-12 DIAGNOSIS — Z5181 Encounter for therapeutic drug level monitoring: Secondary | ICD-10-CM | POA: Diagnosis not present

## 2018-12-12 DIAGNOSIS — E785 Hyperlipidemia, unspecified: Secondary | ICD-10-CM | POA: Diagnosis not present

## 2018-12-12 DIAGNOSIS — E119 Type 2 diabetes mellitus without complications: Secondary | ICD-10-CM | POA: Diagnosis not present

## 2018-12-13 ENCOUNTER — Other Ambulatory Visit: Payer: BLUE CROSS/BLUE SHIELD

## 2018-12-16 ENCOUNTER — Other Ambulatory Visit: Payer: BLUE CROSS/BLUE SHIELD

## 2018-12-16 DIAGNOSIS — M25611 Stiffness of right shoulder, not elsewhere classified: Secondary | ICD-10-CM | POA: Diagnosis not present

## 2019-01-06 ENCOUNTER — Emergency Department (HOSPITAL_BASED_OUTPATIENT_CLINIC_OR_DEPARTMENT_OTHER): Payer: BC Managed Care – PPO

## 2019-01-06 ENCOUNTER — Encounter (HOSPITAL_BASED_OUTPATIENT_CLINIC_OR_DEPARTMENT_OTHER): Payer: Self-pay | Admitting: Emergency Medicine

## 2019-01-06 ENCOUNTER — Emergency Department (HOSPITAL_BASED_OUTPATIENT_CLINIC_OR_DEPARTMENT_OTHER)
Admission: EM | Admit: 2019-01-06 | Discharge: 2019-01-06 | Disposition: A | Discharge status: Home / Self Care | Payer: BC Managed Care – PPO | Attending: Emergency Medicine | Admitting: Emergency Medicine

## 2019-01-06 ENCOUNTER — Emergency Department (HOSPITAL_COMMUNITY): Payer: BC Managed Care – PPO

## 2019-01-06 ENCOUNTER — Encounter (HOSPITAL_COMMUNITY): Payer: Self-pay

## 2019-01-06 ENCOUNTER — Other Ambulatory Visit: Payer: Self-pay

## 2019-01-06 ENCOUNTER — Emergency Department (HOSPITAL_COMMUNITY)
Admission: EM | Admit: 2019-01-06 | Discharge: 2019-01-07 | Disposition: A | Discharge status: Home / Self Care | Payer: BC Managed Care – PPO | Source: Home / Self Care | Attending: Emergency Medicine | Admitting: Emergency Medicine

## 2019-01-06 DIAGNOSIS — Y998 Other external cause status: Secondary | ICD-10-CM | POA: Insufficient documentation

## 2019-01-06 DIAGNOSIS — R7989 Other specified abnormal findings of blood chemistry: Secondary | ICD-10-CM | POA: Insufficient documentation

## 2019-01-06 DIAGNOSIS — R29818 Other symptoms and signs involving the nervous system: Secondary | ICD-10-CM | POA: Insufficient documentation

## 2019-01-06 DIAGNOSIS — M50223 Other cervical disc displacement at C6-C7 level: Secondary | ICD-10-CM | POA: Diagnosis not present

## 2019-01-06 DIAGNOSIS — Y9389 Activity, other specified: Secondary | ICD-10-CM | POA: Insufficient documentation

## 2019-01-06 DIAGNOSIS — Y999 Unspecified external cause status: Secondary | ICD-10-CM | POA: Insufficient documentation

## 2019-01-06 DIAGNOSIS — M545 Low back pain: Secondary | ICD-10-CM | POA: Insufficient documentation

## 2019-01-06 DIAGNOSIS — S134XXA Sprain of ligaments of cervical spine, initial encounter: Secondary | ICD-10-CM | POA: Insufficient documentation

## 2019-01-06 DIAGNOSIS — R200 Anesthesia of skin: Secondary | ICD-10-CM | POA: Insufficient documentation

## 2019-01-06 DIAGNOSIS — Y93I9 Activity, other involving external motion: Secondary | ICD-10-CM | POA: Insufficient documentation

## 2019-01-06 DIAGNOSIS — Y92410 Unspecified street and highway as the place of occurrence of the external cause: Secondary | ICD-10-CM | POA: Insufficient documentation

## 2019-01-06 DIAGNOSIS — R1030 Lower abdominal pain, unspecified: Secondary | ICD-10-CM | POA: Insufficient documentation

## 2019-01-06 DIAGNOSIS — R51 Headache: Secondary | ICD-10-CM | POA: Insufficient documentation

## 2019-01-06 DIAGNOSIS — R531 Weakness: Secondary | ICD-10-CM | POA: Insufficient documentation

## 2019-01-06 DIAGNOSIS — M542 Cervicalgia: Secondary | ICD-10-CM | POA: Diagnosis not present

## 2019-01-06 DIAGNOSIS — R42 Dizziness and giddiness: Secondary | ICD-10-CM | POA: Diagnosis not present

## 2019-01-06 DIAGNOSIS — M2578 Osteophyte, vertebrae: Secondary | ICD-10-CM | POA: Diagnosis not present

## 2019-01-06 DIAGNOSIS — Z7984 Long term (current) use of oral hypoglycemic drugs: Secondary | ICD-10-CM | POA: Insufficient documentation

## 2019-01-06 DIAGNOSIS — R29898 Other symptoms and signs involving the musculoskeletal system: Secondary | ICD-10-CM

## 2019-01-06 DIAGNOSIS — S3991XA Unspecified injury of abdomen, initial encounter: Secondary | ICD-10-CM | POA: Diagnosis not present

## 2019-01-06 DIAGNOSIS — E119 Type 2 diabetes mellitus without complications: Secondary | ICD-10-CM | POA: Diagnosis not present

## 2019-01-06 DIAGNOSIS — M6281 Muscle weakness (generalized): Secondary | ICD-10-CM | POA: Insufficient documentation

## 2019-01-06 DIAGNOSIS — S0990XA Unspecified injury of head, initial encounter: Secondary | ICD-10-CM | POA: Diagnosis not present

## 2019-01-06 DIAGNOSIS — R0789 Other chest pain: Secondary | ICD-10-CM | POA: Insufficient documentation

## 2019-01-06 DIAGNOSIS — S199XXA Unspecified injury of neck, initial encounter: Secondary | ICD-10-CM | POA: Diagnosis not present

## 2019-01-06 DIAGNOSIS — Y92414 Local residential or business street as the place of occurrence of the external cause: Secondary | ICD-10-CM | POA: Insufficient documentation

## 2019-01-06 DIAGNOSIS — R202 Paresthesia of skin: Secondary | ICD-10-CM | POA: Insufficient documentation

## 2019-01-06 DIAGNOSIS — R1031 Right lower quadrant pain: Secondary | ICD-10-CM | POA: Diagnosis not present

## 2019-01-06 DIAGNOSIS — S299XXA Unspecified injury of thorax, initial encounter: Secondary | ICD-10-CM | POA: Diagnosis not present

## 2019-01-06 DIAGNOSIS — R790 Abnormal level of blood mineral: Secondary | ICD-10-CM | POA: Diagnosis not present

## 2019-01-06 LAB — COMPREHENSIVE METABOLIC PANEL
ALT: 16 U/L (ref 0–44)
AST: 17 U/L (ref 15–41)
Albumin: 4.1 g/dL (ref 3.5–5.0)
Alkaline Phosphatase: 64 U/L (ref 38–126)
Anion gap: 10 (ref 5–15)
BUN: 20 mg/dL (ref 6–20)
CO2: 22 mmol/L (ref 22–32)
Calcium: 9.1 mg/dL (ref 8.9–10.3)
Chloride: 106 mmol/L (ref 98–111)
Creatinine, Ser: 1.45 mg/dL — ABNORMAL HIGH (ref 0.61–1.24)
GFR calc Af Amer: >60 mL/min (ref >60)
GFR calc non Af Amer: 53 mL/min — ABNORMAL LOW (ref >60)
Glucose, Bld: 124 mg/dL — ABNORMAL HIGH (ref 70–99)
Potassium: 4.5 mmol/L (ref 3.5–5.1)
Sodium: 138 mmol/L (ref 135–145)
Total Bilirubin: 1.1 mg/dL (ref 0.3–1.2)
Total Protein: 7.1 g/dL (ref 6.5–8.1)

## 2019-01-06 LAB — CBC WITH DIFFERENTIAL/PLATELET
Abs Immature Granulocytes: 0.01 10*3/uL (ref 0.00–0.07)
Basophils Absolute: 0 10*3/uL (ref 0.0–0.1)
Basophils Relative: 1 %
Eosinophils Absolute: 0 10*3/uL (ref 0.0–0.5)
Eosinophils Relative: 0 %
HCT: 40.8 % (ref 39.0–52.0)
Hemoglobin: 13.3 g/dL (ref 13.0–17.0)
Immature Granulocytes: 0 %
Lymphocytes Relative: 32 %
Lymphs Abs: 1.7 10*3/uL (ref 0.7–4.0)
MCH: 32.4 pg (ref 26.0–34.0)
MCHC: 32.6 g/dL (ref 30.0–36.0)
MCV: 99.3 fL (ref 80.0–100.0)
Monocytes Absolute: 0.5 10*3/uL (ref 0.1–1.0)
Monocytes Relative: 10 %
Neutro Abs: 2.9 10*3/uL (ref 1.7–7.7)
Neutrophils Relative %: 57 %
Platelets: 252 10*3/uL (ref 150–400)
RBC: 4.11 MIL/uL — ABNORMAL LOW (ref 4.22–5.81)
RDW: 12.8 % (ref 11.5–15.5)
WBC: 5.2 10*3/uL (ref 4.0–10.5)
nRBC: 0 % (ref 0.0–0.2)

## 2019-01-06 MED ORDER — ACETAMINOPHEN 325 MG PO TABS
ORAL_TABLET | ORAL | Status: AC
  Filled 2019-01-06: qty 2

## 2019-01-06 MED ORDER — LORAZEPAM 2 MG/ML IJ SOLN
1.0000 mg | Freq: Once | INTRAMUSCULAR | Status: AC
  Administered 2019-01-06: 1 mg via INTRAVENOUS
  Filled 2019-01-06: qty 1

## 2019-01-06 MED ORDER — LORAZEPAM 2 MG/ML IJ SOLN: 0.5000 mg | INTRAMUSCULAR | Status: DC | PRN

## 2019-01-06 MED ORDER — IOHEXOL 300 MG/ML  SOLN
100.0000 mL | Freq: Once | INTRAMUSCULAR | Status: AC | PRN
  Administered 2019-01-06: 14:00:00 100 mL via INTRAVENOUS

## 2019-01-06 MED ORDER — ACETAMINOPHEN 325 MG PO TABS
650.0000 mg | ORAL_TABLET | Freq: Once | ORAL | Status: AC
  Administered 2019-01-06: 14:00:00 650 mg via ORAL

## 2019-01-06 NOTE — ED Notes (Signed)
ED Provider at bedside. 
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
## 2019-01-06 NOTE — ED Notes (Signed)
IV site wrapped with gauze dressing for protection during transfer.
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
## 2019-01-06 NOTE — ED Notes (Signed)
Patient transported to CT 

## 2019-01-06 NOTE — ED Notes (Signed)
Pt to transfer to [facility] ED, care of Dr. Gilford Raid for continued care.  Charge Nurse called and notified.

## 2019-01-07 MED ORDER — CYCLOBENZAPRINE HCL 10 MG PO TABS: 10.0000 mg | ORAL_TABLET | Freq: Two times a day (BID) | ORAL | 0 refills | Status: AC | PRN

## 2019-01-07 MED ORDER — CYCLOBENZAPRINE HCL 10 MG PO TABS
10.0000 mg | ORAL_TABLET | Freq: Once | ORAL | Status: AC
  Administered 2019-01-07: 10 mg via ORAL
  Filled 2019-01-07: qty 1
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
## 2019-01-12 DIAGNOSIS — L989 Disorder of the skin and subcutaneous tissue, unspecified: Secondary | ICD-10-CM | POA: Diagnosis not present

## 2019-01-12 DIAGNOSIS — D170 Benign lipomatous neoplasm of skin and subcutaneous tissue of head, face and neck: Secondary | ICD-10-CM | POA: Diagnosis not present

## 2019-01-12 DIAGNOSIS — Z125 Encounter for screening for malignant neoplasm of prostate: Secondary | ICD-10-CM | POA: Diagnosis not present

## 2019-01-20 DIAGNOSIS — M542 Cervicalgia: Secondary | ICD-10-CM | POA: Diagnosis not present

## 2019-01-20 DIAGNOSIS — M25611 Stiffness of right shoulder, not elsewhere classified: Secondary | ICD-10-CM | POA: Diagnosis not present

## 2019-02-19 DIAGNOSIS — R319 Hematuria, unspecified: Secondary | ICD-10-CM | POA: Diagnosis not present

## 2019-02-19 DIAGNOSIS — D2361 Other benign neoplasm of skin of right upper limb, including shoulder: Secondary | ICD-10-CM | POA: Diagnosis not present

## 2019-02-20 DIAGNOSIS — R350 Frequency of micturition: Secondary | ICD-10-CM | POA: Diagnosis not present

## 2019-02-20 DIAGNOSIS — R300 Dysuria: Secondary | ICD-10-CM | POA: Diagnosis not present

## 2019-02-20 DIAGNOSIS — R3915 Urgency of urination: Secondary | ICD-10-CM | POA: Diagnosis not present

## 2019-02-20 DIAGNOSIS — R310 Gross hematuria: Secondary | ICD-10-CM | POA: Diagnosis not present

## 2019-02-20 DIAGNOSIS — R8271 Bacteriuria: Secondary | ICD-10-CM | POA: Diagnosis not present

## 2019-03-18 DIAGNOSIS — Z79899 Other long term (current) drug therapy: Secondary | ICD-10-CM | POA: Diagnosis not present

## 2019-03-18 DIAGNOSIS — E785 Hyperlipidemia, unspecified: Secondary | ICD-10-CM | POA: Diagnosis not present

## 2019-03-18 DIAGNOSIS — Z832 Family history of diseases of the blood and blood-forming organs and certain disorders involving the immune mechanism: Secondary | ICD-10-CM | POA: Diagnosis not present

## 2019-03-18 DIAGNOSIS — D539 Nutritional anemia, unspecified: Secondary | ICD-10-CM | POA: Diagnosis not present

## 2019-03-18 DIAGNOSIS — E119 Type 2 diabetes mellitus without complications: Secondary | ICD-10-CM | POA: Diagnosis not present

## 2019-03-31 NOTE — Progress Notes (Signed)
[facility] - Clinic Note  04/07/2019  CHIEF COMPLAINT Patient presents for Diabetic Eye Exam  HISTORY OF PRESENT ILLNESS: Derrick Terry is a 58 y.o. male who presents to the clinic today for:   HPI    Diabetic Eye Exam    Vision is blurred for distance and is blurred for near.  Associated Symptoms Glare.  Diabetes characteristics include Type 2, controlled with diet, on insulin and taking oral medications.  This started years ago.  Blood sugar level is controlled.  Last Blood Glucose 130 (This a.m.).  Last A1C 7.3 (2 mos ago).  I, the attending physician,  performed the HPI with the patient and updated documentation appropriately.          Comments    58 y/o male pt referred by PCP, Dr. Johny BlamerWilliam Terry for DEE.  LEE x 1 yr.  VA OU becoming a little more blurred over past yr or so.  Pt also has mild dry eye and seasonal allergies.  Only wears glasses for near.  Denies pain, flashes, floaters, but c/o occasional glare at night.  [place] prn OU.  Pt may have had eye laser for leaky blood vessels in the past, but is not entirely sure.       Last edited by Derrick Terry, Derrick Curl, MD on 04/07/2019  1:59 PM. (History)    pt states he is here bc he asked his PCP for a referral to a new eye dr, pt states he was seeing Dr. Dione BoozeGroat and wasn't "excited" about the services there, he states he last saw them about 18 months ago, pt states he has never been told he has any diabetic disease in his eyes, pt has been diabetic for several years and takes oral medication for it   Referring physician: Johny BlamerHarris, William, MD [phone] [address] [place] [id number]  HISTORICAL INFORMATION:   Selected notes from the MEDICAL RECORD NUMBER DEE - Referred by PCP, Dr. Johny BlamerWilliam Terry Pt has diabetic retinopathy Pt is on insulin   CURRENT MEDICATIONS: No current outpatient medications on file. (Ophthalmic Drugs)   No current facility-administered medications for this visit.  (Ophthalmic  Drugs)   Current Outpatient Medications (Other)  Medication Sig  . atorvastatin (LIPITOR) 10 MG tablet Take 10 mg by mouth daily.  . B-D UF III MINI PEN NEEDLES 31G X 5 MM MISC USE TO ADMINISTER VICTOZA ONCE A DAY  . celecoxib (CELEBREX) 200 MG capsule Take 200 mg by mouth daily.  . CONTOUR NEXT TEST test strip USE STRIP TO CHECK GLUCOSE ONCE DAILY  . cyclobenzaprine (FLEXERIL) 10 MG tablet Take 1 tablet (10 mg total) by mouth 2 (two) times daily as needed.  . Empagliflozin-metFORMIN HCl (SYNJARDY PO) Take by mouth.  . levocetirizine (XYZAL) 5 MG tablet Take 5 mg by mouth every evening.  . lidocaine (XYLOCAINE) 5 % ointment Apply topically as needed.  . Liraglutide (VICTOZA [place]) Inject into the skin daily.  . metFORMIN (GLUCOPHAGE) 1000 MG tablet Take 1,000 mg by mouth 2 (two) times daily with a meal.  . Microlet Lancets MISC USE TO CHECK GLUCOSE ONCE DAILY  . phenazopyridine (PYRIDIUM) 100 MG tablet Take 100 mg by mouth 3 (three) times daily as needed.  . pioglitazone (ACTOS) 45 MG tablet Take 45 mg by mouth daily.  . predniSONE (STERAPRED UNI-PAK 48 TAB) 5 MG (48) TBPK tablet See admin instructions.  . quinapril (ACCUPRIL) 10 MG tablet Take 10 mg by mouth daily.  .Marland Kitchen  tadalafil (CIALIS) 20 MG tablet Take 20 mg by mouth daily as needed.  . valACYclovir (VALTREX) 1000 MG tablet SMARTSIG:2 Tablet(s) By Mouth Every 12 Hours PRN  . valACYclovir (VALTREX) 500 MG tablet Take 500 mg by mouth as needed.    No current facility-administered medications for this visit.  (Other)      REVIEW OF SYSTEMS: ROS    Positive for: Endocrine, Eyes   Negative for: Constitutional, Gastrointestinal, Neurological, Skin, Genitourinary, Musculoskeletal, HENT, Cardiovascular, Respiratory, Psychiatric, Allergic/Imm, Heme/Lymph   Last edited by Derrick Terry, COA on 04/07/2019  1:46 PM. (History)       ALLERGIES Allergies  Allergen Reactions  . Toradol [Ketorolac Tromethamine]     itching    PAST  MEDICAL HISTORY Past Medical History:  Diagnosis Date  . Diabetes mellitus without complication (HCC)   . Diabetic retinopathy (HCC)   . ED (erectile dysfunction)   . Hematuria   . Herpes labialis    Past Surgical History:  Procedure Laterality Date  . KNEE ARTHROSCOPY Left   . KNEE ARTHROSCOPY Right     FAMILY HISTORY History reviewed. No pertinent family history.  SOCIAL HISTORY Social History   Tobacco Use  . Smoking status: Never Smoker  . Smokeless tobacco: Never Used  Substance Use Topics  . Alcohol use: No  . Drug use: No         OPHTHALMIC EXAM:  Base Eye Exam    Visual Acuity (Snellen - Linear)      Right Left   Dist [place] 20/50 +2 20/40 -   Dist ph [place] NI 20/30       Tonometry (Tonopen, 1:51 PM)      Right Left   Pressure 15 13       Pupils      Dark Light Shape React APD   Right 3 2 Round Brisk None   Left 3 2 Round Brisk None       Visual Fields (Counting fingers)      Left Right    Full Full       Extraocular Movement      Right Left    Full, Ortho Full, Ortho       Neuro/Psych    Oriented x3: Yes   Mood/Affect: Normal       Dilation    Both eyes: 1.0% Mydriacyl, 2.5% Phenylephrine @ 1:51 PM        Slit Lamp and Fundus Exam    Slit Lamp Exam      Right Left   Lids/Lashes Mild Meibomian gland dysfunction Mild Meibomian gland dysfunction   Conjunctiva/Sclera Mild Melanosis Mild Melanosis   Cornea Trace Punctate epithelial erosions, Debris in tear film Trace Punctate epithelial erosions, Debris in tear film   Anterior Chamber Deep and clear, narrow temporal angle Deep and clear, narrow temporal angle   Iris Round and dilated, No NVI Round and dilated, No NVI   Lens 2+ Nuclear sclerosis, 2-3+ Cortical cataract 2-3+ Nuclear sclerosis, 3+ Cortical cataract   Vitreous Vitreous syneresis Vitreous syneresis       Fundus Exam      Right Left   Disc Pink and Sharp Pink and Sharp   C/D Ratio 0.55 0.5   Macula Blunted foveal reflex,  central cystic changes, scattered Microaneurysms / IRH with cluster just IN to fovea Blunted foveal reflex, central cystic changes, scattered Microaneurysms and IRH   Vessels Vascular attenuation, mild Tortuousity Vascular attenuation, mild Tortuousity   Periphery Attached, scattered exudates  nasally Attached, large pigmented CR scar IT periphery, scattered MA / IRH        Refraction    Manifest Refraction      Sphere Cylinder Dist VA   Right +0.50 Sphere 20/40   Left +0.50 Sphere 20/30          IMAGING AND PROCEDURES  Imaging and Procedures for @  OCT, Retina - OU - Both Eyes       Right Eye Quality was good. Central Foveal Thickness: 413. Progression has no prior data. Findings include abnormal foveal contour, intraretinal fluid, no SRF, intraretinal hyper-reflective material.   Left Eye Quality was good. Central Foveal Thickness: 294. Progression has no prior data. Findings include normal foveal contour, intraretinal fluid, no SRF (Focal cystic changes / mild DME inferior to fovea, trace ERM).   Notes *Images captured and stored on drive  Diagnosis / Impression:  +DME OU (OD>OS)  Clinical management:  See below  Abbreviations: NFP - Normal foveal profile. CME - cystoid macular edema. PED - pigment epithelial detachment. IRF - intraretinal fluid. SRF - subretinal fluid. EZ - ellipsoid zone. ERM - epiretinal membrane. ORA - outer retinal atrophy. ORT - outer retinal tubulation. SRHM - subretinal hyper-reflective material        Intravitreal Injection, Pharmacologic Agent - OD - Right Eye       Time Out 04/07/2019. 2:55 PM. Confirmed correct patient, procedure, site, and patient consented.   Anesthesia Topical anesthesia was used. Anesthetic medications included Lidocaine 2%, Proparacaine 0.5%.   Procedure Preparation included 5% betadine to ocular surface, eyelid speculum. A 30 gauge needle was used.   Injection:  1.25 mg Bevacizumab (AVASTIN) SOLN    NDC: 82956-213-08, Lot: [id number] , Expiration date: 06/17/2019   Route: Intravitreal, Site: Right Eye, Waste: 0 mL  Post-op Post injection exam found visual acuity of at least counting fingers. The patient tolerated the procedure well. There were no complications. The patient received written and verbal post procedure care education.                 ASSESSMENT/PLAN:    ICD-10-CM   1. Moderate nonproliferative diabetic retinopathy of both eyes with macular edema associated with type 2 diabetes mellitus (HCC)  Y69.4854 Intravitreal Injection, Pharmacologic Agent - OD - Right Eye    Bevacizumab (AVASTIN) SOLN 1.25 mg  2. Retinal edema  H35.81 OCT, Retina - OU - Both Eyes  3. Combined forms of age-related cataract of both eyes  H25.813     1,2. Moderate Non-proliferative diabetic retinopathy, OU  - The incidence, risk factors for progression, natural history and treatment options for diabetic retinopathy  were discussed with patient.    - The need for close monitoring of blood glucose, blood pressure, and serum lipids, avoiding cigarette or any type of tobacco, and the need for long term follow up was also discussed with patient.  - exam shows scattered MA, IRH, clustered centrally OU  - OCT shows diabetic macular edema, OU   - The natural history, pathology, and characteristics of diabetic macular edema discussed with patient.  A generalized discussion of the major clinical trials concerning treatment of diabetic macular edema (ETDRS, DCT, SCORE, RISE / RIDE, and ongoing DRCR net studies) was completed.  This discussion included mention of the various approaches to treating diabetic macular edema (observation, laser photocoagulation, anti-VEGF injections with lucentis / Avastin / Eylea, steroid injections with Kenalog / Ozurdex, and intraocular surgery with vitrectomy).  The goal hemoglobin A1C of 6-7 was discussed,  as well as importance of smoking cessation and hypertension control.   Need for ongoing treatment and monitoring were specifically discussed with reference to chronic nature of diabetic macular edema.  - recommend IVA #1 OD today, 11.03.20  - pt wishes to proceed  - RBA of procedure discussed, questions answered  - informed consent obtained and signed  - see procedure note  - f/u in 2 wks, OCT, DFE, FA (optos, transit OD), possible injection OS   3. Mixed form age related cataract OU  - The symptoms of cataract, surgical options, and treatments and risks were discussed with patient.  - discussed diagnosis and progression  - not yet visually significant  - monitor for now     Ophthalmic Meds Ordered this visit:  Meds ordered this encounter  Medications  . Bevacizumab (AVASTIN) SOLN 1.25 mg   Return in about 2 weeks (around 04/21/2019) for f/u NPDR OU, DFE, OCT, FA.  There are no Patient Instructions on file for this visit.  Explained the diagnoses, plan, and follow up with the patient and they expressed understanding.  Patient expressed understanding of the importance of proper follow up care.   Electronically signed by Estill Bakes, COT 03/31/19 10:24 a.m.  This document serves as a record of services personally performed by Gardiner Sleeper, MD, PhD. It was created on their behalf by Ernest Mallick, OA, an ophthalmic assistant. The creation of this record is the provider's dictation and/or activities during the visit.    Electronically signed by: Ernest Mallick, OA 11.03.2020 10:35 AM   Gardiner Sleeper, M.D., Ph.D. Diseases & Surgery of the Retina and Vitreous [facility]   I have reviewed the above documentation for accuracy and completeness, and I agree with the above. Gardiner Sleeper, M.D., Ph.D. 04/09/19 10:35 AM    Abbreviations: M myopia (nearsighted); A astigmatism; H hyperopia (farsighted); P presbyopia; Mrx spectacle prescription;  CTL contact lenses; OD right eye; OS left eye; OU both eyes  XT exotropia; ET  esotropia; PEK punctate epithelial keratitis; PEE punctate epithelial erosions; DES dry eye syndrome; MGD meibomian gland dysfunction; ATs artificial tears; PFAT's preservative free artificial tears; [place] nuclear sclerotic cataract; PSC posterior subcapsular cataract; ERM epi-retinal membrane; PVD posterior vitreous detachment; RD retinal detachment; DM diabetes mellitus; DR diabetic retinopathy; NPDR non-proliferative diabetic retinopathy; PDR proliferative diabetic retinopathy; CSME clinically significant macular edema; DME diabetic macular edema; dbh dot blot hemorrhages; CWS cotton wool spot; POAG primary open angle glaucoma; C/D cup-to-disc ratio; HVF humphrey visual field; GVF goldmann visual field; OCT optical coherence tomography; IOP intraocular pressure; BRVO Branch retinal vein occlusion; CRVO central retinal vein occlusion; CRAO central retinal artery occlusion; BRAO branch retinal artery occlusion; RT retinal tear; SB scleral buckle; PPV pars plana vitrectomy; VH Vitreous hemorrhage; PRP panretinal laser photocoagulation; IVK intravitreal kenalog; VMT vitreomacular traction; MH Macular hole;  NVD neovascularization of the disc; NVE neovascularization elsewhere; AREDS age related eye disease study; ARMD age related macular degeneration; POAG primary open angle glaucoma; EBMD epithelial/anterior basement membrane dystrophy; ACIOL anterior chamber intraocular lens; IOL intraocular lens; PCIOL posterior chamber intraocular lens; Phaco/IOL phacoemulsification with intraocular lens placement; [place] photorefractive keratectomy; LASIK laser assisted in situ keratomileusis; HTN hypertension; DM diabetes mellitus; COPD chronic obstructive pulmonary disease

## 2019-04-07 ENCOUNTER — Encounter (INDEPENDENT_AMBULATORY_CARE_PROVIDER_SITE_OTHER): Payer: Self-pay | Admitting: Ophthalmology

## 2019-04-07 ENCOUNTER — Ambulatory Visit (INDEPENDENT_AMBULATORY_CARE_PROVIDER_SITE_OTHER): Payer: BC Managed Care – PPO | Admitting: Ophthalmology

## 2019-04-07 DIAGNOSIS — H3581 Retinal edema: Secondary | ICD-10-CM | POA: Diagnosis not present

## 2019-04-07 DIAGNOSIS — E113313 Type 2 diabetes mellitus with moderate nonproliferative diabetic retinopathy with macular edema, bilateral: Secondary | ICD-10-CM

## 2019-04-07 DIAGNOSIS — I10 Essential (primary) hypertension: Secondary | ICD-10-CM | POA: Diagnosis not present

## 2019-04-07 DIAGNOSIS — H25813 Combined forms of age-related cataract, bilateral: Secondary | ICD-10-CM

## 2019-04-07 DIAGNOSIS — Z0000 Encounter for general adult medical examination without abnormal findings: Secondary | ICD-10-CM | POA: Diagnosis not present

## 2019-04-07 MED ORDER — BEVACIZUMAB CHEMO INJECTION 1.25MG/0.05ML SYRINGE FOR KALEIDOSCOPE
1.2500 mg | INTRAVITREAL | Status: AC | PRN
  Administered 2019-04-07: 22:00:00 1.25 mg via INTRAVITREAL

## 2019-04-09 DIAGNOSIS — K602 Anal fissure, unspecified: Secondary | ICD-10-CM | POA: Diagnosis not present

## 2019-04-09 DIAGNOSIS — R310 Gross hematuria: Secondary | ICD-10-CM | POA: Diagnosis not present

## 2019-04-16 NOTE — Progress Notes (Signed)
[facility] - Clinic Note  04/20/2019  CHIEF COMPLAINT Patient presents for Retina Follow Up  HISTORY OF PRESENT ILLNESS: Derrick Terry is a 58 y.o. male who presents to the clinic today for:   HPI    Retina Follow Up    Patient presents with  Diabetic Retinopathy.  In both eyes.  This started 2 weeks ago.  Since onset it is stable.  I, the attending physician,  performed the HPI with the patient and updated documentation appropriately.          Comments    F?U NPDR OU. Patient states his vision is about the same, denies flashes, floaters and ocular pain. Pt BS 93 this am, denies hypo/hpyerglycemic episodes.        Last edited by [name], MD on 04/20/2019  4:49 PM. (History)    pt states   Referring physician: [name], MD [phone],  [place] [address]  HISTORICAL INFORMATION:   Selected notes from the MEDICAL RECORD NUMBER DEE - Referred by PCP, Dr. [name] Pt has diabetic retinopathy Pt is on insulin   CURRENT MEDICATIONS: No current outpatient medications on file. (Ophthalmic Drugs)   No current facility-administered medications for this visit.  (Ophthalmic Drugs)   Current Outpatient Medications (Other)  Medication Sig  . atorvastatin (LIPITOR) 10 MG tablet Take 10 mg by mouth daily.  . B-D UF III MINI PEN NEEDLES 31G X 5 MM MISC USE TO ADMINISTER VICTOZA ONCE A DAY  . celecoxib (CELEBREX) 200 MG capsule Take 200 mg by mouth daily.  . CONTOUR NEXT TEST test strip USE STRIP TO CHECK GLUCOSE ONCE DAILY  . cyclobenzaprine (FLEXERIL) 10 MG tablet Take 1 tablet (10 mg total) by mouth 2 (two) times daily as needed.  . Empagliflozin-metFORMIN HCl (SYNJARDY PO) Take by mouth.  . levocetirizine (XYZAL) 5 MG tablet Take 5 mg by mouth every evening.  . lidocaine (XYLOCAINE) 5 % ointment Apply topically as needed.  . Liraglutide (VICTOZA [place]) Inject into the skin daily.  . metFORMIN (GLUCOPHAGE) 1000 MG  tablet Take 1,000 mg by mouth 2 (two) times daily with a meal.  . Microlet Lancets MISC USE TO CHECK GLUCOSE ONCE DAILY  . phenazopyridine (PYRIDIUM) 100 MG tablet Take 100 mg by mouth 3 (three) times daily as needed.  . pioglitazone (ACTOS) 45 MG tablet Take 45 mg by mouth daily.  . predniSONE (STERAPRED UNI-PAK 48 TAB) 5 MG (48) TBPK tablet See admin instructions.  . quinapril (ACCUPRIL) 10 MG tablet Take 10 mg by mouth daily.  . tadalafil (CIALIS) 20 MG tablet Take 20 mg by mouth daily as needed.  . valACYclovir (VALTREX) 1000 MG tablet SMARTSIG:2 Tablet(s) By Mouth Every 12 Hours PRN  . valACYclovir (VALTREX) 500 MG tablet Take 500 mg by mouth as needed.    No current facility-administered medications for this visit.  (Other)      REVIEW OF SYSTEMS: ROS    Positive for: Endocrine, Eyes   Negative for: Constitutional, Gastrointestinal, Neurological, Skin, Genitourinary, Musculoskeletal, HENT, Cardiovascular, Respiratory, Psychiatric, Allergic/Imm, Heme/Lymph   Last edited by Eldridge Scot, LPN on 58/52/7782  1:36 PM. (History)       ALLERGIES Allergies  Allergen Reactions  . Toradol [Ketorolac Tromethamine]     itching    PAST MEDICAL HISTORY Past Medical History:  Diagnosis Date  . Diabetes mellitus without complication (HCC)   . Diabetic retinopathy (HCC)   . ED (erectile dysfunction)   .  Hematuria   . Herpes labialis    Past Surgical History:  Procedure Laterality Date  . KNEE ARTHROSCOPY Left   . KNEE ARTHROSCOPY Right     FAMILY HISTORY History reviewed. No pertinent family history.  SOCIAL HISTORY Social History   Tobacco Use  . Smoking status: Never Smoker  . Smokeless tobacco: Never Used  Substance Use Topics  . Alcohol use: No  . Drug use: No         OPHTHALMIC EXAM:  Base Eye Exam    Visual Acuity (Snellen - Linear)      Right Left   Dist [place] 20/50 -2 20/40   Dist ph  20/50 -1 NI       Tonometry (Tonopen, 1:43 PM)      Right  Left   Pressure 16 12       Pupils      Dark Light Shape React APD   Right 3 2 Round Brisk None   Left 3 2 Round Brisk None       Visual Fields (Counting fingers)      Left Right    Full Full       Extraocular Movement      Right Left    Full, Ortho Full, Ortho       Neuro/Psych    Oriented x3: Yes   Mood/Affect: Normal       Dilation    Both eyes: 1.0% Mydriacyl, 2.5% Phenylephrine @ 1:42 PM        Slit Lamp and Fundus Exam    Slit Lamp Exam      Right Left   Lids/Lashes Mild Meibomian gland dysfunction Mild Meibomian gland dysfunction   Conjunctiva/Sclera Mild Melanosis Mild Melanosis   Cornea Trace Punctate epithelial erosions, Debris in tear film Trace Punctate epithelial erosions, Debris in tear film   Anterior Chamber Deep and clear, narrow temporal angle Deep and clear, narrow temporal angle   Iris Round and dilated, No NVI Round and dilated, No NVI   Lens 2+ Nuclear sclerosis, 2-3+ Cortical cataract 2-3+ Nuclear sclerosis, 3+ Cortical cataract   Vitreous Vitreous syneresis Vitreous syneresis       Fundus Exam      Right Left   Disc Pink and Sharp Pink and Sharp   C/D Ratio 0.55 0.5   Macula Blunted foveal reflex, central cystic changes, scattered Microaneurysms / IRH with cluster just IN to fovea Blunted foveal reflex, central cystic changes, scattered Microaneurysms and IRH   Vessels Vascular attenuation, mild Tortuousity Vascular attenuation, mild Tortuousity   Periphery Attached, scattered exudates nasally Attached, large pigmented CR scar IT periphery, scattered MA / IRH          IMAGING AND PROCEDURES  Imaging and Procedures for @TODAY @  OCT, Retina - OU - Both Eyes       Right Eye Quality was good. Central Foveal Thickness: 445. Progression has worsened. Findings include abnormal foveal contour, intraretinal fluid, no SRF, intraretinal hyper-reflective material (Interval increase in central cyst/IRF).   Left Eye Quality was good. Central  Foveal Thickness: 297. Progression has been stable. Findings include normal foveal contour, intraretinal fluid, no SRF (Focal cystic changes / mild DME inferior to fovea, trace ERM).   Notes *Images captured and stored on drive  Diagnosis / Impression:  +DME OU (OD>OS) OD: interval increase in central cyst/IRF OS: no change from prior  Clinical management:  See below  Abbreviations: NFP - Normal foveal profile. CME - cystoid macular edema. PED -  pigment epithelial detachment. IRF - intraretinal fluid. SRF - subretinal fluid. EZ - ellipsoid zone. ERM - epiretinal membrane. ORA - outer retinal atrophy. ORT - outer retinal tubulation. SRHM - subretinal hyper-reflective material        Fluorescein Angiography Optos (Transit OD)       Right Eye   Progression has no prior data. Early phase findings include delayed filling, microaneurysm, vascular perfusion defect. Mid/Late phase findings include microaneurysm, leakage, vascular perfusion defect (Capillary non-perfusion temporal periphery).   Left Eye   Progression has no prior data. Early phase findings include microaneurysm. Mid/Late phase findings include microaneurysm, leakage (Capillary non-perfusion temporal periphery).   Notes **Images stored on drive**  Impression: Moderate NPDR OU Late leaking MA OU Capillary non-perfusion temporal periphery OU No NV OU        Intravitreal Injection, Pharmacologic Agent - OS - Left Eye       Time Out 04/20/2019. 2:57 PM. Confirmed correct patient, procedure, site, and patient consented.   Anesthesia Topical anesthesia was used. Anesthetic medications included Lidocaine 2%, Proparacaine 0.5%.   Procedure Preparation included 5% betadine to ocular surface, eyelid speculum. A 30 gauge needle was used.   Injection:  1.25 mg Bevacizumab (AVASTIN) SOLN   NDC: 16109-604-54, Lot: [id number] , Expiration date: 07/10/2019   Route: Intravitreal, Site: Left Eye, Waste: 0  mL  Post-op Post injection exam found visual acuity of at least counting fingers. The patient tolerated the procedure well. There were no complications. The patient received written and verbal post procedure care education.                 ASSESSMENT/PLAN:    ICD-10-CM   1. Moderate nonproliferative diabetic retinopathy of both eyes with macular edema associated with type 2 diabetes mellitus (HCC)  J62.8315 Intravitreal Injection, Pharmacologic Agent - OS - Left Eye    Bevacizumab (AVASTIN) SOLN 1.25 mg  2. Retinal edema  H35.81 OCT, Retina - OU - Both Eyes  3. Combined forms of age-related cataract of both eyes  H25.813   4. Hypertensive retinopathy of both eyes  H35.033 Fluorescein Angiography Optos (Transit OD)    1,2. Moderate Non-proliferative diabetic retinopathy, OU  - exam shows scattered MA, IRH, clustered centrally OU  - OCT shows diabetic macular edema, OU              - S/P IVA OD #1 (11.03.20)  - FA 11.16.2020 shows late leaking MA OU, mild peripheral nonperfusion OU, no NV OU  - recommend IVA #1 OS today, 11.16.20 for DME  - pt wishes to proceed  - RBA of procedure discussed, questions answered  - informed consent obtained and signed  - see procedure note  - f/u December 1 -- DFE/OCT  3. Mixed form age related cataract OU  - The symptoms of cataract, surgical options, and treatments and risks were discussed with patient.  - discussed diagnosis and progression  - not yet visually significant  - monitor for now     Ophthalmic Meds Ordered this visit:  Meds ordered this encounter  Medications  . Bevacizumab (AVASTIN) SOLN 1.25 mg   Return for f/u December 1 NPDR OU, DFE, OCT.  There are no Patient Instructions on file for this visit.  Explained the diagnoses, plan, and follow up with the patient and they expressed understanding.  Patient expressed understanding of the importance of proper follow up care.    This document serves as a record of  services personally performed by Karie Chimera, MD,  PhD. It was created on their behalf by Herby AbrahamAshley English, COA, a certified ophthalmic assistant. The creation of this record is the provider's dictation and/or activities during the visit.    Electronically signed by: Herby AbrahamAshley English, COA 11.13.2020 4:55 PM   This document serves as a record of services personally performed by Karie ChimeraBrian G. Loyde Orth, MD, PhD. It was created on their behalf by Laurian BrimAmanda Brown, OA, an ophthalmic assistant. The creation of this record is the provider's dictation and/or activities during the visit.    Electronically signed by: Laurian BrimAmanda Brown, OA 11.16.2020 4:55 PM  Karie ChimeraBrian G. Jennie Hannay, M.D., Ph.D. Diseases & Surgery of the Retina and Vitreous [facility]  I have reviewed the above documentation for accuracy and completeness, and I agree with the above. Karie ChimeraBrian G. Madgie Dhaliwal, M.D., Ph.D. 04/20/19 4:55 PM   Abbreviations: M myopia (nearsighted); A astigmatism; H hyperopia (farsighted); P presbyopia; Mrx spectacle prescription;  CTL contact lenses; OD right eye; OS left eye; OU both eyes  XT exotropia; ET esotropia; PEK punctate epithelial keratitis; PEE punctate epithelial erosions; DES dry eye syndrome; MGD meibomian gland dysfunction; ATs artificial tears; PFAT's preservative free artificial tears; NSC nuclear sclerotic cataract; PSC posterior subcapsular cataract; ERM epi-retinal membrane; PVD posterior vitreous detachment; RD retinal detachment; DM diabetes mellitus; DR diabetic retinopathy; NPDR non-proliferative diabetic retinopathy; PDR proliferative diabetic retinopathy; CSME clinically significant macular edema; DME diabetic macular edema; dbh dot blot hemorrhages; CWS cotton wool spot; POAG primary open angle glaucoma; C/D cup-to-disc ratio; HVF humphrey visual field; GVF goldmann visual field; OCT optical coherence tomography; IOP intraocular pressure; BRVO Branch retinal vein occlusion; CRVO central retinal  vein occlusion; CRAO central retinal artery occlusion; BRAO branch retinal artery occlusion; RT retinal tear; SB scleral buckle; PPV pars plana vitrectomy; VH Vitreous hemorrhage; PRP panretinal laser photocoagulation; IVK intravitreal kenalog; VMT vitreomacular traction; MH Macular hole;  NVD neovascularization of the disc; NVE neovascularization elsewhere; AREDS age related eye disease study; ARMD age related macular degeneration; POAG primary open angle glaucoma; EBMD epithelial/anterior basement membrane dystrophy; ACIOL anterior chamber intraocular lens; IOL intraocular lens; PCIOL posterior chamber intraocular lens; Phaco/IOL phacoemulsification with intraocular lens placement; PRK photorefractive keratectomy; LASIK laser assisted in situ keratomileusis; HTN hypertension; DM diabetes mellitus; COPD chronic obstructive pulmonary disease

## 2019-04-20 ENCOUNTER — Ambulatory Visit (INDEPENDENT_AMBULATORY_CARE_PROVIDER_SITE_OTHER): Payer: BC Managed Care – PPO | Admitting: Ophthalmology

## 2019-04-20 ENCOUNTER — Encounter (INDEPENDENT_AMBULATORY_CARE_PROVIDER_SITE_OTHER): Payer: Self-pay | Admitting: Ophthalmology

## 2019-04-20 DIAGNOSIS — E113313 Type 2 diabetes mellitus with moderate nonproliferative diabetic retinopathy with macular edema, bilateral: Secondary | ICD-10-CM | POA: Diagnosis not present

## 2019-04-20 DIAGNOSIS — H25813 Combined forms of age-related cataract, bilateral: Secondary | ICD-10-CM | POA: Diagnosis not present

## 2019-04-20 DIAGNOSIS — H3581 Retinal edema: Secondary | ICD-10-CM | POA: Diagnosis not present

## 2019-04-20 DIAGNOSIS — H35033 Hypertensive retinopathy, bilateral: Secondary | ICD-10-CM

## 2019-04-20 MED ORDER — BEVACIZUMAB CHEMO INJECTION 1.25MG/0.05ML SYRINGE FOR KALEIDOSCOPE
1.2500 mg | INTRAVITREAL | Status: AC | PRN
  Administered 2019-04-20: 1.25 mg via INTRAVITREAL

## 2019-04-21 ENCOUNTER — Encounter (INDEPENDENT_AMBULATORY_CARE_PROVIDER_SITE_OTHER): Payer: BC Managed Care – PPO | Admitting: Ophthalmology

## 2019-04-24 NOTE — Progress Notes (Signed)
[facility] Note  05/05/2019  CHIEF COMPLAINT Patient presents for Retina Follow Up  HISTORY OF PRESENT ILLNESS: Derrick Terry is a 58 y.o. male who presents to the clinic today for:   HPI    Retina Follow Up    Patient presents with  Diabetic Retinopathy.  In both eyes.  This started weeks ago.  Severity is moderate.  Duration of weeks.  Since onset it is stable.  I, the attending physician,  performed the HPI with the patient and updated documentation appropriately.          Comments    BS: 115 this AM Pt states vision is about the same OU.  Patient denies eye pain or discomfort--had some mild discomfort post injection after last visit but is better now.  Patient denies any new or worsening floaters or fol OU.       Last edited by Bernarda Caffey, MD on 05/05/2019  1:21 PM. (History)    pt states   Referring physician: Shirline Frees, MD [address] [place],  [place] [address]  HISTORICAL INFORMATION:   Selected notes from the [facility] - Referred by PCP, Dr. Shirline Frees   CURRENT MEDICATIONS: No current outpatient medications on file. (Ophthalmic Drugs)   No current facility-administered medications for this visit.  (Ophthalmic Drugs)   Current Outpatient Medications (Other)  Medication Sig  . atorvastatin (LIPITOR) 10 MG tablet Take 10 mg by mouth daily.  . B-D UF III MINI PEN NEEDLES 31G X 5 MM MISC USE TO ADMINISTER VICTOZA ONCE A DAY  . celecoxib (CELEBREX) 200 MG capsule Take 200 mg by mouth daily.  . CONTOUR NEXT TEST test strip USE STRIP TO CHECK GLUCOSE ONCE DAILY  . cyclobenzaprine (FLEXERIL) 10 MG tablet Take 1 tablet (10 mg total) by mouth 2 (two) times daily as needed.  . Empagliflozin-metFORMIN HCl (SYNJARDY PO) Take by mouth.  . levocetirizine (XYZAL) 5 MG tablet Take 5 mg by mouth every evening.  . lidocaine (XYLOCAINE) 5 % ointment Apply topically as needed.  . Liraglutide (VICTOZA [place])  Inject into the skin daily.  . metFORMIN (GLUCOPHAGE) 1000 MG tablet Take 1,000 mg by mouth 2 (two) times daily with a meal.  . Microlet Lancets MISC USE TO CHECK GLUCOSE ONCE DAILY  . phenazopyridine (PYRIDIUM) 100 MG tablet Take 100 mg by mouth 3 (three) times daily as needed.  . pioglitazone (ACTOS) 45 MG tablet Take 45 mg by mouth daily.  . predniSONE (STERAPRED UNI-PAK 48 TAB) 5 MG (48) TBPK tablet See admin instructions.  . quinapril (ACCUPRIL) 10 MG tablet Take 10 mg by mouth daily.  . tadalafil (CIALIS) 20 MG tablet Take 20 mg by mouth daily as needed.  . valACYclovir (VALTREX) 1000 MG tablet SMARTSIG:2 Tablet(s) By Mouth Every 12 Hours PRN  . valACYclovir (VALTREX) 500 MG tablet Take 500 mg by mouth as needed.    No current facility-administered medications for this visit.  (Other)      REVIEW OF SYSTEMS: ROS    Positive for: Endocrine, Eyes   Negative for: Constitutional, Gastrointestinal, Neurological, Skin, Genitourinary, Musculoskeletal, HENT, Cardiovascular, Respiratory, Psychiatric, Allergic/Imm, Heme/Lymph   Last edited by Doneen Poisson on 05/05/2019  9:43 AM. (History)       ALLERGIES Allergies  Allergen Reactions  . Toradol [Ketorolac Tromethamine]     itching    PAST MEDICAL HISTORY Past Medical History:  Diagnosis Date  . Diabetes mellitus without  complication (HCC)   . Diabetic retinopathy (HCC)   . ED (erectile dysfunction)   . Hematuria   . Herpes labialis    Past Surgical History:  Procedure Laterality Date  . KNEE ARTHROSCOPY Left   . KNEE ARTHROSCOPY Right     FAMILY HISTORY History reviewed. No pertinent family history.  SOCIAL HISTORY Social History   Tobacco Use  . Smoking status: Never Smoker  . Smokeless tobacco: Never Used  Substance Use Topics  . Alcohol use: No  . Drug use: No         OPHTHALMIC EXAM:  Base Eye Exam    Visual Acuity (Snellen - Linear)      Right Left   Dist [place] 20/40 -1 20/30 -1   Dist ph [place] NI  20/30       Tonometry (Tonopen, 9:47 AM)      Right Left   Pressure 25 21       Pupils      Dark Light Shape React APD   Right 3 2 Round Minimal 0   Left 3 2 Round Minimal 0       Visual Fields      Left Right    Full Full       Extraocular Movement      Right Left    Full Full       Neuro/Psych    Oriented x3: Yes   Mood/Affect: Normal       Dilation    Both eyes: 1.0% Mydriacyl, 2.5% Phenylephrine @ 9:47 AM        Slit Lamp and Fundus Exam    Slit Lamp Exam      Right Left   Lids/Lashes Mild Meibomian gland dysfunction Mild Meibomian gland dysfunction   Conjunctiva/Sclera Mild Melanosis Mild Melanosis   Cornea Trace Punctate epithelial erosions, Debris in tear film Trace Punctate epithelial erosions, Debris in tear film   Anterior Chamber Deep and clear, narrow temporal angle Deep and clear, narrow temporal angle   Iris Round and dilated, No NVI Round and dilated, No NVI   Lens 2+ Nuclear sclerosis, 2-3+ Cortical cataract 2-3+ Nuclear sclerosis, 3+ Cortical cataract   Vitreous Vitreous syneresis Vitreous syneresis       Fundus Exam      Right Left   Disc Pink and Sharp Pink and Sharp   C/D Ratio 0.55 0.5   Macula Blunted foveal reflex, persistent central cystic changes, scattered Microaneurysms / IRH with cluster just IN to fovea Blunted foveal reflex, persistent central cystic changes, scattered Microaneurysms and IRH   Vessels Vascular attenuation, mild Tortuousity Vascular attenuation, mild Tortuousity   Periphery Attached, scattered exudates nasally Attached, large pigmented CR scar IT periphery, scattered MA / IRH          IMAGING AND PROCEDURES  Imaging and Procedures for @TODAY @  OCT, Retina - OU - Both Eyes       Right Eye Quality was good. Central Foveal Thickness: 455. Progression has been stable. Findings include abnormal foveal contour, intraretinal fluid, no SRF, intraretinal hyper-reflective material (Persistent central cyst/IRF).    Left Eye Quality was good. Central Foveal Thickness: 300. Progression has been stable. Findings include normal foveal contour, intraretinal fluid, no SRF (persistent cystic changes / mild DME inferior to fovea, trace ERM).   Notes *Images captured and stored on drive  Diagnosis / Impression:  +DME OU (OD>OS) OD: persistent central cyst/IRF OS: persistent cystic changes / mild DME inferior to fovea, trace ERM --  no significant change from prior  Clinical management:  See below  Abbreviations: NFP - Normal foveal profile. CME - cystoid macular edema. PED - pigment epithelial detachment. IRF - intraretinal fluid. SRF - subretinal fluid. EZ - ellipsoid zone. ERM - epiretinal membrane. ORA - outer retinal atrophy. ORT - outer retinal tubulation. SRHM - subretinal hyper-reflective material        Intravitreal Injection, Pharmacologic Agent - OD - Right Eye       Time Out 05/05/2019. 11:27 AM. Confirmed correct patient, procedure, site, and patient consented.   Anesthesia Topical anesthesia was used. Anesthetic medications included Lidocaine 2%, Proparacaine 0.5%.   Procedure Preparation included 5% betadine to ocular surface, eyelid speculum. A 30 gauge needle was used.   Injection:  1.25 mg Bevacizumab (AVASTIN) SOLN   NDC: [id number], Lot: [id number] , Expiration date: 07/10/2019   Route: Intravitreal, Site: Right Eye, Waste: 0 mL  Post-op Post injection exam found visual acuity of at least counting fingers. The patient tolerated the procedure well. There were no complications. The patient received written and verbal post procedure care education.                 ASSESSMENT/PLAN:    ICD-10-CM   1. Moderate nonproliferative diabetic retinopathy of both eyes with macular edema associated with type 2 diabetes mellitus (HCC)  [id number] Intravitreal Injection, Pharmacologic Agent - OD - Right Eye    Bevacizumab (AVASTIN) SOLN 1.25 mg  2. Retinal edema  H35.81 OCT,  Retina - OU - Both Eyes  3. Combined forms of age-related cataract of both eyes  H25.813   4. Hypertensive retinopathy of both eyes  H35.033     1,2. Moderate Non-proliferative diabetic retinopathy, OU             - S/P IVA OD #1 (11.03.20)  - S/P IVA OS #1 (11.16.20)  - FA (11.16.2020) shows late leaking MA OU, mild peripheral nonperfusion OU, no NV OU  - exam shows scattered MA, IRH, clustered centrally OU  - OCT shows persistent diabetic macular edema, OU   - recommend IVA OD #2 today, 12.01.20  - pt wishes to proceed  - RBA of procedure discussed, questions answered  - informed consent obtained and signed  - see procedure note  - f/u 5 weeks -- DFE/OCT/possible injection OU  3. Mixed form age related cataract OU  - The symptoms of cataract, surgical options, and treatments and risks were discussed with patient.  - discussed diagnosis and progression  - not yet visually significant  - monitor for now     Ophthalmic Meds Ordered this visit:  Meds ordered this encounter  Medications  . Bevacizumab (AVASTIN) SOLN 1.25 mg   Return in about 4 weeks (around 06/02/2019) for f/u NPDR OU, DFE, OCT.  There are no Patient Instructions on file for this visit.  Explained the diagnoses, plan, and follow up with the patient and they expressed understanding.  Patient expressed understanding of the importance of proper follow up care.    This document serves as a record of services personally performed by Karie ChimeraBrian G. Rinda Rollyson, MD, PhD. It was created on their behalf by Cristopher EstimableAndrew Baxley, COT an ophthalmic technician. The creation of this record is the provider's dictation and/or activities during the visit.    Electronically signed by: Cristopher Estimablendrew Baxley, COT 04/24/19 @ 1:24 PM   This document serves as a record of services personally performed by Karie ChimeraBrian G. Pearle Wandler, MD, PhD. It was created on their behalf by Laurian BrimAmanda Brown,  OA, an ophthalmic assistant. The creation of this record is the provider's  dictation and/or activities during the visit.    Electronically signed by: Laurian Brim, OA 12.01.2020 1:24 PM   Karie Chimera, M.D., Ph.D. Diseases & Surgery of the Retina and Vitreous [facility] 05/05/2019     I have reviewed the above documentation for accuracy and completeness, and I agree with the above. Karie Chimera, M.D., Ph.D. 05/05/19 1:24 PM    Abbreviations: M myopia (nearsighted); A astigmatism; H hyperopia (farsighted); P presbyopia; Mrx spectacle prescription;  CTL contact lenses; OD right eye; OS left eye; OU both eyes  XT exotropia; ET esotropia; PEK punctate epithelial keratitis; PEE punctate epithelial erosions; DES dry eye syndrome; MGD meibomian gland dysfunction; ATs artificial tears; PFAT's preservative free artificial tears; NSC nuclear sclerotic cataract; PSC posterior subcapsular cataract; ERM epi-retinal membrane; PVD posterior vitreous detachment; RD retinal detachment; DM diabetes mellitus; DR diabetic retinopathy; NPDR non-proliferative diabetic retinopathy; PDR proliferative diabetic retinopathy; CSME clinically significant macular edema; DME diabetic macular edema; dbh dot blot hemorrhages; CWS cotton wool spot; POAG primary open angle glaucoma; C/D cup-to-disc ratio; HVF humphrey visual field; GVF goldmann visual field; OCT optical coherence tomography; IOP intraocular pressure; BRVO Branch retinal vein occlusion; CRVO central retinal vein occlusion; CRAO central retinal artery occlusion; BRAO branch retinal artery occlusion; RT retinal tear; SB scleral buckle; PPV pars plana vitrectomy; VH Vitreous hemorrhage; PRP panretinal laser photocoagulation; IVK intravitreal kenalog; VMT vitreomacular traction; MH Macular hole;  NVD neovascularization of the disc; NVE neovascularization elsewhere; AREDS age related eye disease study; ARMD age related macular degeneration; POAG primary open angle glaucoma; EBMD epithelial/anterior basement membrane  dystrophy; ACIOL anterior chamber intraocular lens; IOL intraocular lens; PCIOL posterior chamber intraocular lens; Phaco/IOL phacoemulsification with intraocular lens placement; PRK photorefractive keratectomy; LASIK laser assisted in situ keratomileusis; HTN hypertension; DM diabetes mellitus; COPD chronic obstructive pulmonary disease

## 2019-05-05 ENCOUNTER — Ambulatory Visit (INDEPENDENT_AMBULATORY_CARE_PROVIDER_SITE_OTHER): Payer: BC Managed Care – PPO | Admitting: Ophthalmology

## 2019-05-05 ENCOUNTER — Encounter (INDEPENDENT_AMBULATORY_CARE_PROVIDER_SITE_OTHER): Payer: Self-pay | Admitting: Ophthalmology

## 2019-05-05 ENCOUNTER — Other Ambulatory Visit: Payer: Self-pay

## 2019-05-05 DIAGNOSIS — E113313 Type 2 diabetes mellitus with moderate nonproliferative diabetic retinopathy with macular edema, bilateral: Secondary | ICD-10-CM

## 2019-05-05 DIAGNOSIS — H35033 Hypertensive retinopathy, bilateral: Secondary | ICD-10-CM | POA: Diagnosis not present

## 2019-05-05 DIAGNOSIS — H3581 Retinal edema: Secondary | ICD-10-CM | POA: Diagnosis not present

## 2019-05-05 DIAGNOSIS — H25813 Combined forms of age-related cataract, bilateral: Secondary | ICD-10-CM

## 2019-05-05 MED ORDER — BEVACIZUMAB CHEMO INJECTION 1.25MG/0.05ML SYRINGE FOR KALEIDOSCOPE
1.2500 mg | INTRAVITREAL | Status: AC | PRN
  Administered 2019-05-05: 1.25 mg via INTRAVITREAL

## 2019-05-09 ENCOUNTER — Other Ambulatory Visit: Payer: Self-pay

## 2019-05-09 ENCOUNTER — Emergency Department (HOSPITAL_BASED_OUTPATIENT_CLINIC_OR_DEPARTMENT_OTHER): Payer: BC Managed Care – PPO

## 2019-05-09 ENCOUNTER — Emergency Department (HOSPITAL_BASED_OUTPATIENT_CLINIC_OR_DEPARTMENT_OTHER)
Admission: EM | Admit: 2019-05-09 | Discharge: 2019-05-09 | Disposition: A | Discharge status: Home / Self Care | Payer: BC Managed Care – PPO | Attending: Emergency Medicine | Admitting: Emergency Medicine

## 2019-05-09 ENCOUNTER — Encounter (HOSPITAL_BASED_OUTPATIENT_CLINIC_OR_DEPARTMENT_OTHER): Payer: Self-pay | Admitting: Emergency Medicine

## 2019-05-09 DIAGNOSIS — E119 Type 2 diabetes mellitus without complications: Secondary | ICD-10-CM | POA: Diagnosis not present

## 2019-05-09 DIAGNOSIS — M25531 Pain in right wrist: Secondary | ICD-10-CM | POA: Diagnosis not present

## 2019-05-09 DIAGNOSIS — Z79899 Other long term (current) drug therapy: Secondary | ICD-10-CM | POA: Diagnosis not present

## 2019-05-09 DIAGNOSIS — S60551A Superficial foreign body of right hand, initial encounter: Secondary | ICD-10-CM | POA: Diagnosis not present

## 2019-05-09 DIAGNOSIS — W458XXA Other foreign body or object entering through skin, initial encounter: Secondary | ICD-10-CM | POA: Diagnosis not present

## 2019-05-09 DIAGNOSIS — S63501A Unspecified sprain of right wrist, initial encounter: Secondary | ICD-10-CM | POA: Insufficient documentation

## 2019-05-09 DIAGNOSIS — S0003XA Contusion of scalp, initial encounter: Secondary | ICD-10-CM | POA: Diagnosis not present

## 2019-05-09 DIAGNOSIS — Y929 Unspecified place or not applicable: Secondary | ICD-10-CM | POA: Insufficient documentation

## 2019-05-09 DIAGNOSIS — Y9389 Activity, other specified: Secondary | ICD-10-CM | POA: Insufficient documentation

## 2019-05-09 DIAGNOSIS — Y999 Unspecified external cause status: Secondary | ICD-10-CM | POA: Insufficient documentation

## 2019-05-09 DIAGNOSIS — Z794 Long term (current) use of insulin: Secondary | ICD-10-CM | POA: Insufficient documentation

## 2019-05-09 DIAGNOSIS — S0990XA Unspecified injury of head, initial encounter: Secondary | ICD-10-CM | POA: Diagnosis not present

## 2019-05-09 DIAGNOSIS — W108XXA Fall (on) (from) other stairs and steps, initial encounter: Secondary | ICD-10-CM | POA: Insufficient documentation

## 2019-05-09 DIAGNOSIS — S199XXA Unspecified injury of neck, initial encounter: Secondary | ICD-10-CM | POA: Diagnosis not present

## 2019-05-09 DIAGNOSIS — W19XXXA Unspecified fall, initial encounter: Secondary | ICD-10-CM

## 2019-05-09 DIAGNOSIS — S6991XA Unspecified injury of right wrist, hand and finger(s), initial encounter: Secondary | ICD-10-CM | POA: Diagnosis not present

## 2019-05-09 MED ORDER — HYDROCODONE-ACETAMINOPHEN 5-325 MG PO TABS
1.0000 | ORAL_TABLET | Freq: Once | ORAL | Status: AC
  Administered 2019-05-09: 23:00:00 1 via ORAL
  Filled 2019-05-09: qty 1

## 2019-05-09 NOTE — ED Notes (Signed)
Patient transported to CT 

## 2019-05-09 NOTE — ED Provider Notes (Signed)
[facility] EMERGENCY DEPARTMENT Provider Note   CSN: [id number] Arrival date & time: 05/09/19  2019     History   Chief Complaint Chief Complaint  Patient presents with   Fall    HPI Derrick Terry is a 58 y.o. male.     The history is provided by the patient and medical records. No language interpreter was used.   Derrick Terry is a 58 y.o. male who presents to the Emergency Department complaining of fall. He was moving a Child psychotherapist earlier today down some stairs what it fell onto him and he fell down the stairs. He did hit his head. He was days but did not pass out. He was able to continue moving but developed severe pain in his right wrist later in the day. He has associated mild headache. He is right-hand dominant. He denies any chest pain, abdominal pain, numbness, weakness. He does have some stiffness in his neck. He has a history of prior neck fusion. Symptoms are moderate and constant nature. Past Medical History:  Diagnosis Date   Diabetes mellitus without complication (HCC)    Diabetic retinopathy (HCC)    ED (erectile dysfunction)    Hematuria    Herpes labialis     Patient Active Problem List   Diagnosis Date Noted   Anal fissure 01/13/2013    Past Surgical History:  Procedure Laterality Date   KNEE ARTHROSCOPY Left    KNEE ARTHROSCOPY Right         Home Medications    Prior to Admission medications   Medication Sig Start Date End Date Taking? Authorizing Provider  atorvastatin (LIPITOR) 10 MG tablet Take 10 mg by mouth daily.    [provider]  B-D UF III MINI PEN NEEDLES 31G X 5 MM MISC USE TO ADMINISTER VICTOZA ONCE A DAY 03/23/19   [provider]  celecoxib (CELEBREX) 200 MG capsule Take 200 mg by mouth daily.    [provider]  CONTOUR NEXT TEST test strip USE STRIP TO CHECK GLUCOSE ONCE DAILY 03/05/19   [provider]  cyclobenzaprine (FLEXERIL) 10 MG tablet Take 1 tablet (10 mg  total) by mouth 2 (two) times daily as needed. 01/07/19   McDonald, Mia A, PA-C  Empagliflozin-metFORMIN HCl (SYNJARDY PO) Take by mouth.    [provider]  levocetirizine (XYZAL) 5 MG tablet Take 5 mg by mouth every evening.    [provider]  lidocaine (XYLOCAINE) 5 % ointment Apply topically as needed. 01/13/13   Romie Levee, MD  Liraglutide [facility]) Inject into the skin daily.    [provider]  metFORMIN (GLUCOPHAGE) 1000 MG tablet Take 1,000 mg by mouth 2 (two) times daily with a meal.    [provider]  Microlet Lancets MISC USE TO CHECK GLUCOSE ONCE DAILY 03/23/19   [provider]  phenazopyridine (PYRIDIUM) 100 MG tablet Take 100 mg by mouth 3 (three) times daily as needed. 02/20/19   [provider]  pioglitazone (ACTOS) 45 MG tablet Take 45 mg by mouth daily.    [provider]  predniSONE (STERAPRED UNI-PAK 48 TAB) 5 MG (48) TBPK tablet See admin instructions. 01/20/19   [provider]  quinapril (ACCUPRIL) 10 MG tablet Take 10 mg by mouth daily.    [provider]  tadalafil (CIALIS) 20 MG tablet Take 20 mg by mouth daily as needed. 02/19/19   [provider]  valACYclovir (VALTREX) 1000 MG tablet SMARTSIG:2 Tablet(s) By Mouth  Every 12 Hours PRN 02/19/19   [provider]  valACYclovir (VALTREX) 500 MG tablet Take 500 mg by mouth as needed.     [provider]    Family History History reviewed. No pertinent family history.  Social History Social History   Tobacco Use   Smoking status: Never Smoker   Smokeless tobacco: Never Used  Substance Use Topics   Alcohol use: No   Drug use: No     Allergies   Toradol [ketorolac tromethamine]   Review of Systems Review of Systems  All other systems reviewed and are negative.    Physical Exam Updated Vital Signs BP (!) 149/81 (BP Location: Left Arm)    Pulse 60    Temp 99.1 F (37.3 C) (Oral)    Resp 18     Ht 5\' 10"  (1.778 m)    Wt 81.6 kg    SpO2 100%    BMI 25.83 kg/m   Physical Exam Vitals signs and nursing note reviewed.  Constitutional:      Appearance: He is well-developed.  HENT:     Head: Normocephalic.     Comments: Abrasions to left forehead.  Cardiovascular:     Rate and Rhythm: Normal rate and regular rhythm.     Heart sounds: No murmur.  Pulmonary:     Effort: Pulmonary effort is normal. No respiratory distress.     Breath sounds: Normal breath sounds.  Abdominal:     Palpations: Abdomen is soft.     Tenderness: There is no abdominal tenderness. There is no guarding or rebound.  Musculoskeletal:        General: No tenderness.     Comments: 2+ radial pulses.  Moderate tenderness to palpation over right wrist and proximal hand.  No snuffbox tenderness.  Wiggles fingers.  Pronation/supination intact.  Decreased flexion/extension at wrist.  ROM intact at elbow.  Sensation to light touch intact throughout digits.    Skin:    General: Skin is warm and dry.  Neurological:     Mental Status: He is alert and oriented to person, place, and time.  Psychiatric:        Behavior: Behavior normal.      ED Treatments / Results  Labs (all labs ordered are listed, but only abnormal results are displayed) Labs Reviewed - No data to display  EKG None  Radiology Dg Wrist Complete Right  Result Date: 05/09/2019 CLINICAL DATA:  [address] down stairs while moving furniture today, distal RIGHT wrist and distal radial pain EXAM: RIGHT WRIST - COMPLETE 3+ VIEW COMPARISON:  None FINDINGS: Osseous mineralization normal. Joint spaces preserved. Minimal erosive/cystic changes at proximal pole of scaphoid and at lunate suggesting mild inflammatory arthropathy. No acute fracture, dislocation or bone destruction. IMPRESSION: No acute fracture or dislocation. Lucencies at the scaphoid and lunate suggest inflammatory arthropathy involving the radiocarpal joint. Electronically Signed   By: Lavonia Dana  M.D.   On: 05/09/2019 20:59   Ct Head Wo Contrast  Result Date: 05/09/2019 CLINICAL DATA:  Dresser fell  on him EXAM: CT HEAD WITHOUT CONTRAST TECHNIQUE: Contiguous axial images were obtained from the base of the skull through the vertex without intravenous contrast. COMPARISON:  None. FINDINGS: Brain: No evidence of acute territorial infarction, hemorrhage, hydrocephalus,extra-axial collection or mass lesion/mass effect. Normal gray-white differentiation. Ventricles are normal in size and contour. Vascular: No hyperdense vessel or unexpected calcification. Skull: The skull is intact. No fracture or focal lesion identified. Sinuses/Orbits: The visualized paranasal sinuses and mastoid air  cells are clear. The orbits and globes intact. Other: None Cervical spine: Alignment: Physiologic Skull base and vertebrae: The patient is status post ACDF at C4 through C6. Solid interbody osseous fusion is noted. No periprosthetic lucency or fracture is identified. Visualized skull base is intact. No atlanto-occipital dissociation. The vertebral body heights are well maintained. No fracture or pathologic osseous lesion seen. Soft tissues and spinal canal: The visualized paraspinal soft tissues are unremarkable. No prevertebral soft tissue swelling is seen. The spinal canal is grossly unremarkable, no large epidural collection or significant canal narrowing. Disc levels: No significant canal or neural foraminal narrowing is noted. Upper chest: The lung apices are clear. Thoracic inlet is within normal limits. Other: None IMPRESSION: 1.  No acute intracranial abnormality. 2. Status post ACDF from C4 through C6 with solid interbody osseous fusion. No acute hardware abnormality. 3. No acute fracture or malalignment of the cervical spine. Electronically Signed   By: Jonna ClarkBindu  Avutu M.D.   On: 05/09/2019 21:54   Ct Cervical Spine Wo Contrast  Result Date: 05/09/2019 CLINICAL DATA:  Dresser fell  on him EXAM: CT HEAD WITHOUT  CONTRAST TECHNIQUE: Contiguous axial images were obtained from the base of the skull through the vertex without intravenous contrast. COMPARISON:  None. FINDINGS: Brain: No evidence of acute territorial infarction, hemorrhage, hydrocephalus,extra-axial collection or mass lesion/mass effect. Normal gray-white differentiation. Ventricles are normal in size and contour. Vascular: No hyperdense vessel or unexpected calcification. Skull: The skull is intact. No fracture or focal lesion identified. Sinuses/Orbits: The visualized paranasal sinuses and mastoid air cells are clear. The orbits and globes intact. Other: None Cervical spine: Alignment: Physiologic Skull base and vertebrae: The patient is status post ACDF at C4 through C6. Solid interbody osseous fusion is noted. No periprosthetic lucency or fracture is identified. Visualized skull base is intact. No atlanto-occipital dissociation. The vertebral body heights are well maintained. No fracture or pathologic osseous lesion seen. Soft tissues and spinal canal: The visualized paraspinal soft tissues are unremarkable. No prevertebral soft tissue swelling is seen. The spinal canal is grossly unremarkable, no large epidural collection or significant canal narrowing. Disc levels: No significant canal or neural foraminal narrowing is noted. Upper chest: The lung apices are clear. Thoracic inlet is within normal limits. Other: None IMPRESSION: 1.  No acute intracranial abnormality. 2. Status post ACDF from C4 through C6 with solid interbody osseous fusion. No acute hardware abnormality. 3. No acute fracture or malalignment of the cervical spine. Electronically Signed   By: Jonna ClarkBindu  Avutu M.D.   On: 05/09/2019 21:54    Procedures .Foreign Body Removal  Date/Time: 05/09/2019 10:24 PM Performed by: Tilden Fossaees, Brynlynn Walko, MD Authorized by: Tilden Fossaees, Mystery Schrupp, MD  Consent: Verbal consent obtained. Risks and benefits: risks, benefits and alternatives were discussed Patient  identity confirmed: verbally with patient Body area: skin General location: upper extremity Location details: right hand Complexity: simple 1 objects recovered. Objects recovered: splinter Post-procedure assessment: foreign body removed Patient tolerance: patient tolerated the procedure well with no immediate complications Comments: Area cleaned with chlorhexidine. Forceps were used to remove a splinter from the palmar surface of the right hand.   (including critical care time)  Medications Ordered in ED Medications  HYDROcodone-acetaminophen (NORCO/VICODIN) 5-325 MG per tablet 1 tablet (has no administration in time range)     Initial Impression / Assessment and Plan / ED Course  I have reviewed the triage vital signs and the nursing notes.  Pertinent labs & imaging results that were available during my care of  the patient were reviewed by me and considered in my medical decision making (see chart for details).        Patient here for evaluation of injuries following a fall that occurred prior to ED arrival. He has abrasions to his left scalp and face as well as pain and swelling to the right wrist. Imaging is negative for serious closed head injury, cervical spine injury. X-rays are negative for fracture or dislocation of the right wrist. He does have significant swelling and tenderness to the right wrist, will place and splint with orthopedics follow-up. He did have a superficial splinter to the palmar surface of the right hand, which was removed. Counseled patient on home care for splinter removal, wrist sprain. Discussed outpatient follow-up and return precautions.  Final Clinical Impressions(s) / ED Diagnoses   Final diagnoses:  Fall, initial encounter  Contusion of scalp, initial encounter  Sprain of right wrist, initial encounter    ED Discharge Orders    None       Tilden Fossa, MD 05/09/19 2226

## 2019-05-09 NOTE — ED Notes (Signed)
ED Provider at bedside. 

## 2019-05-09 NOTE — ED Triage Notes (Signed)
Patient states that he was moving a dresser earlier today and it fell onto him. He has pain to his right wrist and noted abrasions to his left head  - denies any LOC

## 2019-05-12 DIAGNOSIS — F411 Generalized anxiety disorder: Secondary | ICD-10-CM | POA: Diagnosis not present

## 2019-05-13 DIAGNOSIS — F411 Generalized anxiety disorder: Secondary | ICD-10-CM | POA: Diagnosis not present

## 2019-06-01 DIAGNOSIS — F411 Generalized anxiety disorder: Secondary | ICD-10-CM | POA: Diagnosis not present

## 2019-06-11 DIAGNOSIS — F411 Generalized anxiety disorder: Secondary | ICD-10-CM | POA: Diagnosis not present

## 2019-06-17 NOTE — Progress Notes (Signed)
[facility] Note  06/19/2019  CHIEF COMPLAINT Patient presents for Retina Follow Up  HISTORY OF PRESENT ILLNESS: Derrick Terry is a 59 y.o. male who presents to the clinic today for:   HPI    Retina Follow Up    Patient presents with  Diabetic Retinopathy.  In both eyes.  This started weeks ago.  Severity is moderate.  Duration of weeks.  Since onset it is stable.  I, the attending physician,  performed the HPI with the patient and updated documentation appropriately.          Comments    Pt states vision is stable OU.  Patient denies eye pain or discomfort OU.  Patient has floaters only after injection.       Last edited by Bernarda Caffey, MD on 06/19/2019  1:47 PM. (History)    pt states   Referring physician: Shirline Frees, MD [facility],  [place] [address]  HISTORICAL INFORMATION:   Selected notes from the [facility] - Referred by PCP, Dr. Shirline Frees   CURRENT MEDICATIONS: Current Outpatient Medications (Ophthalmic Drugs)  Medication Sig  . brimonidine (ALPHAGAN) 0.2 % ophthalmic solution Place 1 drop into both eyes 2 (two) times daily.   No current facility-administered medications for this visit. (Ophthalmic Drugs)   Current Outpatient Medications (Other)  Medication Sig  . atorvastatin (LIPITOR) 10 MG tablet Take 10 mg by mouth daily.  . B-D UF III MINI PEN NEEDLES 31G X 5 MM MISC USE TO ADMINISTER VICTOZA ONCE A DAY  . celecoxib (CELEBREX) 200 MG capsule Take 200 mg by mouth daily.  . CONTOUR NEXT TEST test strip USE STRIP TO CHECK GLUCOSE ONCE DAILY  . cyclobenzaprine (FLEXERIL) 10 MG tablet Take 1 tablet (10 mg total) by mouth 2 (two) times daily as needed.  . Empagliflozin-metFORMIN HCl (SYNJARDY PO) Take by mouth.  . levocetirizine (XYZAL) 5 MG tablet Take 5 mg by mouth every evening.  . lidocaine (XYLOCAINE) 5 % ointment Apply topically as needed.  . Liraglutide (VICTOZA [place])  Inject into the skin daily.  . metFORMIN (GLUCOPHAGE) 1000 MG tablet Take 1,000 mg by mouth 2 (two) times daily with a meal.  . Microlet Lancets MISC USE TO CHECK GLUCOSE ONCE DAILY  . phenazopyridine (PYRIDIUM) 100 MG tablet Take 100 mg by mouth 3 (three) times daily as needed.  . pioglitazone (ACTOS) 45 MG tablet Take 45 mg by mouth daily.  . predniSONE (STERAPRED UNI-PAK 48 TAB) 5 MG (48) TBPK tablet See admin instructions.  . quinapril (ACCUPRIL) 10 MG tablet Take 10 mg by mouth daily.  . tadalafil (CIALIS) 20 MG tablet Take 20 mg by mouth daily as needed.  . valACYclovir (VALTREX) 1000 MG tablet SMARTSIG:2 Tablet(s) By Mouth Every 12 Hours PRN  . valACYclovir (VALTREX) 500 MG tablet Take 500 mg by mouth as needed.    No current facility-administered medications for this visit. (Other)      REVIEW OF SYSTEMS: ROS    Positive for: Endocrine, Eyes   Negative for: Constitutional, Gastrointestinal, Neurological, Skin, Genitourinary, Musculoskeletal, HENT, Cardiovascular, Respiratory, Psychiatric, Allergic/Imm, Heme/Lymph   Last edited by Doneen Poisson on 06/19/2019  1:11 PM. (History)       ALLERGIES Allergies  Allergen Reactions  . Toradol [Ketorolac Tromethamine]     itching    PAST MEDICAL HISTORY Past Medical History:  Diagnosis Date  . Diabetes mellitus without complication ([place])   .  Diabetic retinopathy (HCC)   . ED (erectile dysfunction)   . Hematuria   . Herpes labialis    Past Surgical History:  Procedure Laterality Date  . KNEE ARTHROSCOPY Left   . KNEE ARTHROSCOPY Right     FAMILY HISTORY History reviewed. No pertinent family history.  SOCIAL HISTORY Social History   Tobacco Use  . Smoking status: Never Smoker  . Smokeless tobacco: Never Used  Substance Use Topics  . Alcohol use: No  . Drug use: No         OPHTHALMIC EXAM:  Base Eye Exam    Visual Acuity (Snellen - Linear)      Right Left   Dist [place] 20/40 -1 20/40 +1   Dist ph [place] NI         Tonometry (Tonopen, 1:13 PM)      Right Left   Pressure 22 19       Pupils      Dark Light Shape React APD   Right 3 2 Round Minimal 0   Left 3 2 Round Minimal 0       Visual Fields      Left Right    Full Full       Extraocular Movement      Right Left    Full Full       Neuro/Psych    Oriented x3: Yes   Mood/Affect: Normal       Dilation    Both eyes: 1.0% Mydriacyl, 2.5% Phenylephrine @ 1:14 PM        Slit Lamp and Fundus Exam    Slit Lamp Exam      Right Left   Lids/Lashes Mild Meibomian gland dysfunction Mild Meibomian gland dysfunction   Conjunctiva/Sclera Mild Melanosis Mild Melanosis   Cornea Trace Punctate epithelial erosions, Debris in tear film Trace Punctate epithelial erosions, Debris in tear film   Anterior Chamber Deep and clear, narrow temporal angle Deep and clear, narrow temporal angle   Iris Round and dilated, No NVI Round and dilated, No NVI   Lens 2+ Nuclear sclerosis, 2-3+ Cortical cataract 2-3+ Nuclear sclerosis, 3+ Cortical cataract   Vitreous Vitreous syneresis Vitreous syneresis       Fundus Exam      Right Left   Disc Pink and Sharp Pink and Sharp   C/D Ratio 0.55 0.5   Macula Blunted foveal reflex, persistent central cystic changes, scattered Microaneurysms / IRH with cluster just IN to fovea -- slightly improved Blunted foveal reflex, persistent central cystic changes - mild improvement, scattered Microaneurysms and IRH   Vessels Vascular attenuation, mild Tortuousity Vascular attenuation, mild Tortuousity   Periphery Attached, scattered exudates nasally Attached, large pigmented CR scar IT periphery, scattered MA / IRH          IMAGING AND PROCEDURES  Imaging and Procedures for @TODAY @  OCT, Retina - OU - Both Eyes       Right Eye Quality was good. Central Foveal Thickness: 399. Progression has improved. Findings include abnormal foveal contour, intraretinal fluid, no SRF, intraretinal hyper-reflective material  (Interval improvement in central cyst/IRF).   Left Eye Quality was good. Central Foveal Thickness: 288. Progression has improved. Findings include normal foveal contour, intraretinal fluid, no SRF (Interval improvement in central IRF; persistent non-central IRF).   Notes *Images captured and stored on drive  Diagnosis / Impression:  +DME OU (OD>OS) OD: Interval improvement in central IRF OS: Interval improvement in central IRF; persistent non-central IRF  Clinical management:  See below  Abbreviations: NFP - Normal foveal profile. CME - cystoid macular edema. PED - pigment epithelial detachment. IRF - intraretinal fluid. SRF - subretinal fluid. EZ - ellipsoid zone. ERM - epiretinal membrane. ORA - outer retinal atrophy. ORT - outer retinal tubulation. SRHM - subretinal hyper-reflective material        Intravitreal Injection, Pharmacologic Agent - OD - Right Eye       Time Out 06/19/2019. 1:39 PM. Confirmed correct patient, procedure, site, and patient consented.   Anesthesia Topical anesthesia was used. Anesthetic medications included Lidocaine 2%, Proparacaine 0.5%.   Procedure Preparation included 5% betadine to ocular surface, eyelid speculum. A 30 gauge needle was used.   Injection:  1.25 mg Bevacizumab (AVASTIN) SOLN   NDC: 51761-607-37, Lot: [id number] , Expiration date: 09/02/2019   Route: Intravitreal, Site: Right Eye, Waste: 0 mL  Post-op Post injection exam found visual acuity of at least counting fingers. The patient tolerated the procedure well. There were no complications. The patient received written and verbal post procedure care education.        Intravitreal Injection, Pharmacologic Agent - OS - Left Eye       Time Out 06/19/2019. 1:39 PM. Confirmed correct patient, procedure, site, and patient consented.   Anesthesia Topical anesthesia was used. Anesthetic medications included Lidocaine 2%, Proparacaine 0.5%.   Procedure Preparation included 5%  betadine to ocular surface, eyelid speculum. A 30 gauge needle was used.   Injection:  1.25 mg Bevacizumab (AVASTIN) SOLN   NDC: 06/21/2019, Lot: [id number] , Expiration date: 11/29/2019   Route: Intravitreal, Site: Left Eye, Waste: 0 mL  Post-op Post injection exam found visual acuity of at least counting fingers. The patient tolerated the procedure well. There were no complications. The patient received written and verbal post procedure care education.                 ASSESSMENT/PLAN:    ICD-10-CM   1. Moderate nonproliferative diabetic retinopathy of both eyes with macular edema associated with type 2 diabetes mellitus (HCC)  : [id number] Intravitreal Injection, Pharmacologic Agent - OD - Right Eye    Intravitreal Injection, Pharmacologic Agent - OS - Left Eye    Bevacizumab (AVASTIN) SOLN 1.25 mg    Bevacizumab (AVASTIN) SOLN 1.25 mg  2. Retinal edema  H35.81 OCT, Retina - OU - Both Eyes  3. Combined forms of age-related cataract of both eyes  H25.813   4. Ocular hypertension, bilateral  H40.053     1,2. Moderate Non-proliferative diabetic retinopathy, OU             - S/P IVA OD #1 (11.03.20), #2 (12.01.20)  - S/P IVA OS #1 (11.16.20)  - FA (11.16.2020) shows late leaking MA OU, mild peripheral nonperfusion OU, no NV OU  - exam shows scattered MA, IRH, clustered centrally OU  - OCT shows OD: Interval improvement in central IRF; OS: Interval improvement in central IRF; persistent non-central IRF  - recommend IVA OU (OD #3 and OS #2) today, 01.15.21  - pt wishes to proceed  - RBA of procedure discussed, questions answered  - informed consent obtained and signed  - see procedure note  - f/u 4 weeks -- DFE/OCT/possible injection OU  3. Mixed form age related cataract OU  - The symptoms of cataract, surgical options, and treatments and risks were discussed with patient.  - discussed diagnosis and progression  - not yet visually significant  - monitor for now  4. Ocular  Hypertension OU  - IOP today  22,19  - will start brimonidine BID OU     Ophthalmic Meds Ordered this visit:  Meds ordered this encounter  Medications  . brimonidine (ALPHAGAN) 0.2 % ophthalmic solution    Sig: Place 1 drop into both eyes 2 (two) times daily.    Dispense:  15 mL    Refill:  6  . Bevacizumab (AVASTIN) SOLN 1.25 mg  . Bevacizumab (AVASTIN) SOLN 1.25 mg   Return in about 4 weeks (around 07/17/2019) for f/u NPDR OU, DFE, OCT.  There are no Patient Instructions on file for this visit.  Explained the diagnoses, plan, and follow up with the patient and they expressed understanding.  Patient expressed understanding of the importance of proper follow up care.    This document serves as a record of services personally performed by Karie Chimera, MD, PhD. It was created on their behalf by Laurian Brim, OA, an ophthalmic assistant. The creation of this record is the provider's dictation and/or activities during the visit.    Electronically signed by: Laurian Brim, OA 01.13.2021 1:58 PM   Karie Chimera, M.D., Ph.D. Diseases & Surgery of the Retina and Vitreous [facility]   I have reviewed the above documentation for accuracy and completeness, and I agree with the above. Karie Chimera, M.D., Ph.D. 06/19/19 1:58 PM    Abbreviations: M myopia (nearsighted); A astigmatism; H hyperopia (farsighted); P presbyopia; Mrx spectacle prescription;  CTL contact lenses; OD right eye; OS left eye; OU both eyes  XT exotropia; ET esotropia; PEK punctate epithelial keratitis; PEE punctate epithelial erosions; DES dry eye syndrome; MGD meibomian gland dysfunction; ATs artificial tears; PFAT's preservative free artificial tears; NSC nuclear sclerotic cataract; PSC posterior subcapsular cataract; ERM epi-retinal membrane; PVD posterior vitreous detachment; RD retinal detachment; DM diabetes mellitus; DR diabetic retinopathy; NPDR non-proliferative diabetic  retinopathy; PDR proliferative diabetic retinopathy; CSME clinically significant macular edema; DME diabetic macular edema; dbh dot blot hemorrhages; CWS cotton wool spot; POAG primary open angle glaucoma; C/D cup-to-disc ratio; HVF humphrey visual field; GVF goldmann visual field; OCT optical coherence tomography; IOP intraocular pressure; BRVO Branch retinal vein occlusion; CRVO central retinal vein occlusion; CRAO central retinal artery occlusion; BRAO branch retinal artery occlusion; RT retinal tear; SB scleral buckle; PPV pars plana vitrectomy; VH Vitreous hemorrhage; PRP panretinal laser photocoagulation; IVK intravitreal kenalog; VMT vitreomacular traction; MH Macular hole;  NVD neovascularization of the disc; NVE neovascularization elsewhere; AREDS age related eye disease study; ARMD age related macular degeneration; POAG primary open angle glaucoma; EBMD epithelial/anterior basement membrane dystrophy; ACIOL anterior chamber intraocular lens; IOL intraocular lens; PCIOL posterior chamber intraocular lens; Phaco/IOL phacoemulsification with intraocular lens placement; PRK photorefractive keratectomy; LASIK laser assisted in situ keratomileusis; HTN hypertension; DM diabetes mellitus; COPD chronic obstructive pulmonary disease

## 2019-06-18 DIAGNOSIS — E119 Type 2 diabetes mellitus without complications: Secondary | ICD-10-CM | POA: Diagnosis not present

## 2019-06-18 DIAGNOSIS — Z832 Family history of diseases of the blood and blood-forming organs and certain disorders involving the immune mechanism: Secondary | ICD-10-CM | POA: Diagnosis not present

## 2019-06-18 DIAGNOSIS — E785 Hyperlipidemia, unspecified: Secondary | ICD-10-CM | POA: Diagnosis not present

## 2019-06-18 DIAGNOSIS — D539 Nutritional anemia, unspecified: Secondary | ICD-10-CM | POA: Diagnosis not present

## 2019-06-18 DIAGNOSIS — Z79899 Other long term (current) drug therapy: Secondary | ICD-10-CM | POA: Diagnosis not present

## 2019-06-19 ENCOUNTER — Ambulatory Visit (INDEPENDENT_AMBULATORY_CARE_PROVIDER_SITE_OTHER): Payer: BC Managed Care – PPO | Admitting: Ophthalmology

## 2019-06-19 ENCOUNTER — Encounter (INDEPENDENT_AMBULATORY_CARE_PROVIDER_SITE_OTHER): Payer: Self-pay | Admitting: Ophthalmology

## 2019-06-19 DIAGNOSIS — H25813 Combined forms of age-related cataract, bilateral: Secondary | ICD-10-CM

## 2019-06-19 DIAGNOSIS — H40053 Ocular hypertension, bilateral: Secondary | ICD-10-CM | POA: Diagnosis not present

## 2019-06-19 DIAGNOSIS — E113313 Type 2 diabetes mellitus with moderate nonproliferative diabetic retinopathy with macular edema, bilateral: Secondary | ICD-10-CM

## 2019-06-19 DIAGNOSIS — H3581 Retinal edema: Secondary | ICD-10-CM

## 2019-06-19 MED ORDER — BEVACIZUMAB CHEMO INJECTION 1.25MG/0.05ML SYRINGE FOR KALEIDOSCOPE
1.2500 mg | INTRAVITREAL | Status: AC | PRN
  Administered 2019-06-19: 1.25 mg via INTRAVITREAL

## 2019-06-19 MED ORDER — BRIMONIDINE TARTRATE 0.2 % OP SOLN: 1.0000 [drp] | Freq: Two times a day (BID) | OPHTHALMIC | 6 refills | Status: AC

## 2019-06-26 DIAGNOSIS — F411 Generalized anxiety disorder: Secondary | ICD-10-CM | POA: Diagnosis not present

## 2019-06-29 DIAGNOSIS — F411 Generalized anxiety disorder: Secondary | ICD-10-CM | POA: Diagnosis not present

## 2019-07-02 DIAGNOSIS — F411 Generalized anxiety disorder: Secondary | ICD-10-CM | POA: Diagnosis not present

## 2019-07-14 NOTE — Progress Notes (Signed)
[facility] - Clinic Note  07/17/2019  CHIEF COMPLAINT Patient presents for Retina Follow Up  HISTORY OF PRESENT ILLNESS: Derrick Terry is a 59 y.o. male who presents to the clinic today for:   HPI    Retina Follow Up    In both eyes.  Severity is moderate.  Duration of 4 weeks.  Since onset it is stable.  I, the attending physician,  performed the HPI with the patient and updated documentation appropriately.          Comments    Patient states difficult to tell if any vision changes. Takes brimonidine bid OU for IOP control. Last a1c was 7.4, checked a month ago. Patient is on synjardy 10/998 mg and victoza injection for blood sugar control.       Last edited by [name], MD on 07/17/2019  9:44 AM. (History)    pt states his vision occasionally seems worse  Referring physician: [name], MD [phone] [address] [place],  [place] [address]  HISTORICAL INFORMATION:   Selected notes from the MEDICAL RECORD NUMBER DEE - Referred by PCP, Dr. [name]   CURRENT MEDICATIONS: Current Outpatient Medications (Ophthalmic Drugs)  Medication Sig  . brimonidine (ALPHAGAN) 0.2 % ophthalmic solution Place 1 drop into both eyes 2 (two) times daily.   No current facility-administered medications for this visit. (Ophthalmic Drugs)   Current Outpatient Medications (Other)  Medication Sig  . atorvastatin (LIPITOR) 10 MG tablet Take 10 mg by mouth daily.  . B-D UF III MINI PEN NEEDLES 31G X 5 MM MISC USE TO ADMINISTER VICTOZA ONCE A DAY  . celecoxib (CELEBREX) 200 MG capsule Take 200 mg by mouth daily.  . CONTOUR NEXT TEST test strip USE STRIP TO CHECK GLUCOSE ONCE DAILY  . cyclobenzaprine (FLEXERIL) 10 MG tablet Take 1 tablet (10 mg total) by mouth 2 (two) times daily as needed.  . Empagliflozin-metFORMIN HCl (SYNJARDY PO) Take by mouth.  . levocetirizine (XYZAL) 5 MG tablet Take 5 mg by mouth every evening.  . lidocaine (XYLOCAINE) 5 %  ointment Apply topically as needed.  . Liraglutide (VICTOZA [place]) Inject into the skin daily.  . Microlet Lancets MISC USE TO CHECK GLUCOSE ONCE DAILY  . phenazopyridine (PYRIDIUM) 100 MG tablet Take 100 mg by mouth 3 (three) times daily as needed.  . pioglitazone (ACTOS) 45 MG tablet Take 45 mg by mouth daily.  . quinapril (ACCUPRIL) 10 MG tablet Take 10 mg by mouth daily.  . tadalafil (CIALIS) 20 MG tablet Take 20 mg by mouth daily as needed.  Marland Kitchen VICTOZA 18 MG/3ML SOPN Inject 1.8 mg into the skin daily.  . metFORMIN (GLUCOPHAGE) 1000 MG tablet Take 1,000 mg by mouth 2 (two) times daily with a meal.  . predniSONE (STERAPRED UNI-PAK 48 TAB) 5 MG (48) TBPK tablet See admin instructions.  . valACYclovir (VALTREX) 1000 MG tablet SMARTSIG:2 Tablet(s) By Mouth Every 12 Hours PRN  . valACYclovir (VALTREX) 500 MG tablet Take 500 mg by mouth as needed.    No current facility-administered medications for this visit. (Other)      REVIEW OF SYSTEMS: ROS    Positive for: Endocrine, Eyes   Negative for: Constitutional, Gastrointestinal, Neurological, Skin, Genitourinary, Musculoskeletal, HENT, Cardiovascular, Respiratory, Psychiatric, Allergic/Imm, Heme/Lymph   Last edited by Annalee Genta D, COT on 07/17/2019  8:17 AM. (History)       ALLERGIES Allergies  Allergen Reactions  . Toradol [Ketorolac Tromethamine]  itching    PAST MEDICAL HISTORY Past Medical History:  Diagnosis Date  . Diabetes mellitus without complication (HCC)   . Diabetic retinopathy (HCC)   . ED (erectile dysfunction)   . Hematuria   . Herpes labialis    Past Surgical History:  Procedure Laterality Date  . KNEE ARTHROSCOPY Left   . KNEE ARTHROSCOPY Right     FAMILY HISTORY History reviewed. No pertinent family history.  SOCIAL HISTORY Social History   Tobacco Use  . Smoking status: Never Smoker  . Smokeless tobacco: Never Used  Substance Use Topics  . Alcohol use: No  . Drug use: No          OPHTHALMIC EXAM:  Base Eye Exam    Visual Acuity (Snellen - Linear)      Right Left   Dist [place] 20/40 -2 20/30 -2   Dist ph [place] NI 20/25 -1       Tonometry (Tonopen, 8:32 AM)      Right Left   Pressure 17 19       Pupils      Dark Light Shape React APD   Right 3 2 Round Minimal None   Left 3 2 Round Minimal None       Visual Fields (Counting fingers)      Left Right    Full Full       Extraocular Movement      Right Left    Full, Ortho Full, Ortho       Neuro/Psych    Oriented x3: Yes   Mood/Affect: Normal       Dilation    Both eyes: 1.0% Mydriacyl, 2.5% Phenylephrine @ 8:32 AM        Slit Lamp and Fundus Exam    Slit Lamp Exam      Right Left   Lids/Lashes Mild Meibomian gland dysfunction Mild Meibomian gland dysfunction   Conjunctiva/Sclera Mild Melanosis Mild Melanosis   Cornea Trace Punctate epithelial erosions, Debris in tear film Trace Punctate epithelial erosions, Debris in tear film   Anterior Chamber Deep and clear, narrow temporal angle Deep and clear, narrow temporal angle   Iris Round and dilated, No NVI Round and dilated, No NVI   Lens 2+ Nuclear sclerosis, 2-3+ Cortical cataract 2-3+ Nuclear sclerosis, 3+ Cortical cataract   Vitreous Vitreous syneresis Vitreous syneresis       Fundus Exam      Right Left   Disc Pink and Sharp Pink and Sharp   C/D Ratio 0.55 0.5   Macula Blunted foveal reflex, persistent central cystic changes -- slightly increased, scattered Microaneurysms / IRH with cluster just IN to fovea -- slightly improved, focal pigment clump ST macula  foveal reflex, persistent cystic changes - slightly improved, scattered Microaneurysms and IRH   Vessels Vascular attenuation, mild Tortuousity Vascular attenuation, mild Tortuousity   Periphery Attached, scattered exudates nasally Attached, large pigmented CR scar IT periphery, scattered MA / IRH        Refraction    Manifest Refraction      Sphere Cylinder Axis Dist VA   Right  +0.75 Sphere  20/30-2   Left +0.75 +0.50 163 20/20-2          IMAGING AND PROCEDURES  Imaging and Procedures for @TODAY @  OCT, Retina - OU - Both Eyes       Right Eye Quality was good. Central Foveal Thickness: 399. Progression has worsened. Findings include abnormal foveal contour, intraretinal fluid, no SRF, intraretinal hyper-reflective material (  Interval increase in central cyst/IRF).   Left Eye Quality was good. Central Foveal Thickness: 288. Progression has improved. Findings include normal foveal contour, intraretinal fluid, no SRF (Interval improvement in IRF/cystic changes).   Notes *Images captured and stored on drive  Diagnosis / Impression:  +DME OU (OD>OS) OD: Interval increase in central IRF OS: Interval improvement in IRF/cystic changes  Clinical management:  See below  Abbreviations: NFP - Normal foveal profile. CME - cystoid macular edema. PED - pigment epithelial detachment. IRF - intraretinal fluid. SRF - subretinal fluid. EZ - ellipsoid zone. ERM - epiretinal membrane. ORA - outer retinal atrophy. ORT - outer retinal tubulation. SRHM - subretinal hyper-reflective material        Intravitreal Injection, Pharmacologic Agent - OD - Right Eye       Time Out 07/17/2019. 9:28 AM. Confirmed correct patient, procedure, site, and patient consented.   Anesthesia Topical anesthesia was used. Anesthetic medications included Lidocaine 2%, Proparacaine 0.5%.   Procedure Preparation included 5% betadine to ocular surface, eyelid speculum. A supplied needle was used.   Injection:  1.25 mg Bevacizumab (AVASTIN) SOLN   NDC: 15726-203-55, Lot: [id number] , Expiration date: 08/19/2019   Route: Intravitreal, Site: Right Eye, Waste: 0 mL  Post-op Post injection exam found visual acuity of at least counting fingers. The patient tolerated the procedure well. There were no complications. The patient received written and verbal post procedure care education.         Intravitreal Injection, Pharmacologic Agent - OS - Left Eye       Time Out 07/17/2019. 9:29 AM. Confirmed correct patient, procedure, site, and patient consented.   Anesthesia Topical anesthesia was used. Anesthetic medications included Lidocaine 2%, Proparacaine 0.5%.   Procedure Preparation included 5% betadine to ocular surface, eyelid speculum. A supplied needle was used.   Injection:  1.25 mg Bevacizumab (AVASTIN) SOLN   NDC: 09/14/2019, Lot: [id number] , Expiration date: 08/19/2019   Route: Intravitreal, Site: Left Eye, Waste: 0 mL  Post-op Post injection exam found visual acuity of at least counting fingers. The patient tolerated the procedure well. There were no complications. The patient received written and verbal post procedure care education.                 ASSESSMENT/PLAN:    ICD-10-CM   1. Moderate nonproliferative diabetic retinopathy of both eyes with macular edema associated with type 2 diabetes mellitus (HCC)  [id number] Intravitreal Injection, Pharmacologic Agent - OD - Right Eye    Intravitreal Injection, Pharmacologic Agent - OS - Left Eye    Bevacizumab (AVASTIN) SOLN 1.25 mg    Bevacizumab (AVASTIN) SOLN 1.25 mg  2. Retinal edema  H35.81 OCT, Retina - OU - Both Eyes  3. Combined forms of age-related cataract of both eyes  H25.813   4. Ocular hypertension, bilateral  H40.053   5. Hypertensive retinopathy of both eyes  H35.033     1,2. Moderate Non-proliferative diabetic retinopathy, OU             - S/P IVA OD #1 (11.03.20), #2 (12.01.20), #3 (01.15.21)  - S/P IVA OS #1 (11.16.20), #2 (01.15.21)  - FA (11.16.2020) shows late leaking MA OU, mild peripheral nonperfusion OU, no NV OU  - exam shows scattered MA, IRH, clustered centrally OU  - OCT shows OD: Interval increase in central IRF; OS: Interval improvement in IRF/cystic changes  - discussed possible resistance to IVA OD and possible switch in medication  - recommend IVA OU (OD #4 and OS #3)  today, 02.12.21  - pt wishes to proceed  - RBA of procedure discussed, questions answered  - informed consent obtained  - Avastin informed consent form signed and scanned on 01.15.2021  - see procedure note  - Eylea4U benefits investigation started 02.12.21  - f/u 4 weeks -- DFE/OCT/possible injection OU  3. Mixed form age related cataract OU  - The symptoms of cataract, surgical options, and treatments and risks were discussed with patient.  - discussed diagnosis and progression  - not yet visually significant  - monitor for now  4. Ocular Hypertension OU  - IOP today 17,19  - continue brimonidine BID OU     Ophthalmic Meds Ordered this visit:  Meds ordered this encounter  Medications  . Bevacizumab (AVASTIN) SOLN 1.25 mg  . Bevacizumab (AVASTIN) SOLN 1.25 mg   Return in about 4 weeks (around 08/14/2019) for Dilated Exam, OCT, Possible Injxn.  There are no Patient Instructions on file for this visit.  Explained the diagnoses, plan, and follow up with the patient and they expressed understanding.  Patient expressed understanding of the importance of proper follow up care.    This document serves as a record of services personally performed by Gardiner Sleeper, MD, PhD. It was created on their behalf by Ernest Mallick, OA, an ophthalmic assistant. The creation of this record is the provider's dictation and/or activities during the visit.    Electronically signed by: Ernest Mallick, OA 02.09.2021 12:29 AM   Gardiner Sleeper, M.D., Ph.D. Diseases & Surgery of the Retina and Vitreous [facility]  I have reviewed the above documentation for accuracy and completeness, and I agree with the above. Gardiner Sleeper, M.D., Ph.D. 07/18/19 12:29 AM     Abbreviations: M myopia (nearsighted); A astigmatism; H hyperopia (farsighted); P presbyopia; Mrx spectacle prescription;  CTL contact lenses; OD right eye; OS left eye; OU both eyes  XT exotropia; ET esotropia; PEK  punctate epithelial keratitis; PEE punctate epithelial erosions; DES dry eye syndrome; MGD meibomian gland dysfunction; ATs artificial tears; PFAT's preservative free artificial tears; [place] nuclear sclerotic cataract; PSC posterior subcapsular cataract; ERM epi-retinal membrane; PVD posterior vitreous detachment; RD retinal detachment; DM diabetes mellitus; DR diabetic retinopathy; NPDR non-proliferative diabetic retinopathy; PDR proliferative diabetic retinopathy; CSME clinically significant macular edema; DME diabetic macular edema; dbh dot blot hemorrhages; CWS cotton wool spot; POAG primary open angle glaucoma; C/D cup-to-disc ratio; HVF humphrey visual field; GVF goldmann visual field; OCT optical coherence tomography; IOP intraocular pressure; BRVO Branch retinal vein occlusion; CRVO central retinal vein occlusion; CRAO central retinal artery occlusion; BRAO branch retinal artery occlusion; RT retinal tear; SB scleral buckle; PPV pars plana vitrectomy; VH Vitreous hemorrhage; PRP panretinal laser photocoagulation; IVK intravitreal kenalog; VMT vitreomacular traction; MH Macular hole;  NVD neovascularization of the disc; NVE neovascularization elsewhere; AREDS age related eye disease study; ARMD age related macular degeneration; POAG primary open angle glaucoma; EBMD epithelial/anterior basement membrane dystrophy; ACIOL anterior chamber intraocular lens; IOL intraocular lens; PCIOL posterior chamber intraocular lens; Phaco/IOL phacoemulsification with intraocular lens placement; [place] photorefractive keratectomy; LASIK laser assisted in situ keratomileusis; HTN hypertension; DM diabetes mellitus; COPD chronic obstructive pulmonary disease

## 2019-07-16 DIAGNOSIS — R310 Gross hematuria: Secondary | ICD-10-CM | POA: Diagnosis not present

## 2019-07-16 DIAGNOSIS — N5201 Erectile dysfunction due to arterial insufficiency: Secondary | ICD-10-CM | POA: Diagnosis not present

## 2019-07-16 DIAGNOSIS — Z125 Encounter for screening for malignant neoplasm of prostate: Secondary | ICD-10-CM | POA: Diagnosis not present

## 2019-07-17 ENCOUNTER — Ambulatory Visit (INDEPENDENT_AMBULATORY_CARE_PROVIDER_SITE_OTHER): Payer: BC Managed Care – PPO | Admitting: Ophthalmology

## 2019-07-17 ENCOUNTER — Other Ambulatory Visit: Payer: Self-pay

## 2019-07-17 ENCOUNTER — Encounter (INDEPENDENT_AMBULATORY_CARE_PROVIDER_SITE_OTHER): Payer: Self-pay | Admitting: Ophthalmology

## 2019-07-17 DIAGNOSIS — H3581 Retinal edema: Secondary | ICD-10-CM

## 2019-07-17 DIAGNOSIS — E113313 Type 2 diabetes mellitus with moderate nonproliferative diabetic retinopathy with macular edema, bilateral: Secondary | ICD-10-CM

## 2019-07-17 DIAGNOSIS — H25813 Combined forms of age-related cataract, bilateral: Secondary | ICD-10-CM | POA: Diagnosis not present

## 2019-07-17 DIAGNOSIS — H40053 Ocular hypertension, bilateral: Secondary | ICD-10-CM | POA: Diagnosis not present

## 2019-07-17 DIAGNOSIS — H35033 Hypertensive retinopathy, bilateral: Secondary | ICD-10-CM

## 2019-07-18 MED ORDER — BEVACIZUMAB CHEMO INJECTION 1.25MG/0.05ML SYRINGE FOR KALEIDOSCOPE
1.2500 mg | INTRAVITREAL | Status: AC | PRN
  Administered 2019-07-18: 1.25 mg via INTRAVITREAL

## 2019-08-11 NOTE — Progress Notes (Signed)
[facility] - Clinic Note  08/14/2019  CHIEF COMPLAINT Patient presents for Retina Follow Up  HISTORY OF PRESENT ILLNESS: Derrick Terry is a 59 y.o. male who presents to the clinic today for:   HPI    Retina Follow Up    Patient presents with  Diabetic Retinopathy.  In both eyes.  This started 4 weeks ago.  Severity is moderate.          Comments    Patient here for 4 weeks retina follow up for NPDR OU. Patient states vision about the same. Some days better than other days. On occasion has eye pain. When reads has to read the sentence again because gets a different meaning.        Last edited by Laddie Aquas, COA on 08/14/2019 10:12 AM. (History)    pt states  Referring physician: [name], MD [phone] [place],  [place] [address]  HISTORICAL INFORMATION:   Selected notes from the MEDICAL RECORD NUMBER DEE - Referred by PCP, Dr. [name]   CURRENT MEDICATIONS: Current Outpatient Medications (Ophthalmic Drugs)  Medication Sig  . brimonidine (ALPHAGAN) 0.2 % ophthalmic solution Place 1 drop into both eyes 2 (two) times daily.   No current facility-administered medications for this visit. (Ophthalmic Drugs)   Current Outpatient Medications (Other)  Medication Sig  . atorvastatin (LIPITOR) 10 MG tablet Take 10 mg by mouth daily.  . B-D UF III MINI PEN NEEDLES 31G X 5 MM MISC USE TO ADMINISTER VICTOZA ONCE A DAY  . celecoxib (CELEBREX) 200 MG capsule Take 200 mg by mouth daily.  . CONTOUR NEXT TEST test strip USE STRIP TO CHECK GLUCOSE ONCE DAILY  . cyclobenzaprine (FLEXERIL) 10 MG tablet Take 1 tablet (10 mg total) by mouth 2 (two) times daily as needed.  . Empagliflozin-metFORMIN HCl (SYNJARDY PO) Take by mouth.  . levocetirizine (XYZAL) 5 MG tablet Take 5 mg by mouth every evening.  . lidocaine (XYLOCAINE) 5 % ointment Apply topically as needed.  . Liraglutide (VICTOZA [place]) Inject into the skin daily.  .  metFORMIN (GLUCOPHAGE) 1000 MG tablet Take 1,000 mg by mouth 2 (two) times daily with a meal.  . Microlet Lancets MISC USE TO CHECK GLUCOSE ONCE DAILY  . phenazopyridine (PYRIDIUM) 100 MG tablet Take 100 mg by mouth 3 (three) times daily as needed.  . pioglitazone (ACTOS) 45 MG tablet Take 45 mg by mouth daily.  . predniSONE (STERAPRED UNI-PAK 48 TAB) 5 MG (48) TBPK tablet See admin instructions.  . quinapril (ACCUPRIL) 10 MG tablet Take 10 mg by mouth daily.  . tadalafil (CIALIS) 20 MG tablet Take 20 mg by mouth daily as needed.  . valACYclovir (VALTREX) 1000 MG tablet SMARTSIG:2 Tablet(s) By Mouth Every 12 Hours PRN  . valACYclovir (VALTREX) 500 MG tablet Take 500 mg by mouth as needed.   Marland Kitchen VICTOZA 18 MG/3ML SOPN Inject 1.8 mg into the skin daily.   No current facility-administered medications for this visit. (Other)      REVIEW OF SYSTEMS: ROS    Positive for: Endocrine, Eyes   Negative for: Constitutional, Gastrointestinal, Neurological, Skin, Genitourinary, Musculoskeletal, HENT, Cardiovascular, Respiratory, Psychiatric, Allergic/Imm, Heme/Lymph   Last edited by Laddie Aquas, COA on 08/14/2019 10:12 AM. (History)       ALLERGIES Allergies  Allergen Reactions  . Toradol [Ketorolac Tromethamine]     itching    PAST MEDICAL HISTORY Past Medical History:  Diagnosis Date  .  Diabetes mellitus without complication (HCC)   . Diabetic retinopathy (HCC)   . ED (erectile dysfunction)   . Hematuria   . Herpes labialis    Past Surgical History:  Procedure Laterality Date  . KNEE ARTHROSCOPY Left   . KNEE ARTHROSCOPY Right     FAMILY HISTORY History reviewed. No pertinent family history.  SOCIAL HISTORY Social History   Tobacco Use  . Smoking status: Never Smoker  . Smokeless tobacco: Never Used  Substance Use Topics  . Alcohol use: No  . Drug use: No         OPHTHALMIC EXAM:  Base Eye Exam    Visual Acuity (Snellen - Linear)      Right Left   Dist [place]  20/40 -2 20/40   Dist ph [place] NI 20/25 -2       Tonometry (Tonopen, 10:08 AM)      Right Left   Pressure 17 17       Pupils      Dark Light Shape React APD   Right 3 2 Round Brisk None   Left 3 2 Round Brisk None       Visual Fields (Counting fingers)      Left Right    Full Full       Extraocular Movement      Right Left    Full, Ortho Full, Ortho       Neuro/Psych    Oriented x3: Yes   Mood/Affect: Normal       Dilation    Both eyes: 1.0% Mydriacyl, 2.5% Phenylephrine @ 10:08 AM        Slit Lamp and Fundus Exam    Slit Lamp Exam      Right Left   Lids/Lashes Mild Meibomian gland dysfunction Mild Meibomian gland dysfunction   Conjunctiva/Sclera Mild Melanosis Mild Melanosis   Cornea Trace Punctate epithelial erosions, Debris in tear film Trace Punctate epithelial erosions, Debris in tear film   Anterior Chamber Deep and clear, narrow temporal angle Deep and clear, narrow temporal angle   Iris Round and dilated, No NVI Round and dilated, No NVI   Lens 2+ Nuclear sclerosis, 2-3+ Cortical cataract 2-3+ Nuclear sclerosis, 3+ Cortical cataract   Vitreous Vitreous syneresis Vitreous syneresis       Fundus Exam      Right Left   Disc Pink and Sharp Pink and Sharp   C/D Ratio 0.55 0.5   Macula Blunted foveal reflex, persistent central cystic changes -- slightly increased, scattered Microaneurysms / IRH with cluster just IN to fovea -- slightly improved, focal pigment clump ST macula Good foveal reflex, persistent cystic changes - persistent, scattered Microaneurysms and IRH   Vessels Vascular attenuation, mild Tortuousity Vascular attenuation, mild Tortuousity   Periphery Attached, scattered MA  Attached, large pigmented CR scar IT periphery, scattered MA / IRH          IMAGING AND PROCEDURES  Imaging and Procedures for @TODAY @  OCT, Retina - OU - Both Eyes       Right Eye Quality was good. Central Foveal Thickness: 462. Progression has worsened. Findings  include abnormal foveal contour, intraretinal fluid, no SRF, intraretinal hyper-reflective material (Interval increase in central cyst, but interval improvement in surrounding IRF).   Left Eye Quality was good. Central Foveal Thickness: 285. Progression has been stable. Findings include normal foveal contour, intraretinal fluid, no SRF (Mild Interval improvement in IRF/cystic changes).   Notes *Images captured and stored on drive  Diagnosis / Impression:  +  DME OU (OD>OS) OD: Interval increase in central cyst, but interval improvement in surrounding IRF OS: mild interval improvement in IRF/cystic changes  Clinical management:  See below  Abbreviations: NFP - Normal foveal profile. CME - cystoid macular edema. PED - pigment epithelial detachment. IRF - intraretinal fluid. SRF - subretinal fluid. EZ - ellipsoid zone. ERM - epiretinal membrane. ORA - outer retinal atrophy. ORT - outer retinal tubulation. SRHM - subretinal hyper-reflective material        Intravitreal Injection, Pharmacologic Agent - OD - Right Eye       Time Out 08/14/2019. 10:29 AM. Confirmed correct patient, procedure, site, and patient consented.   Anesthesia Topical anesthesia was used. Anesthetic medications included Lidocaine 2%, Proparacaine 0.5%.   Procedure Preparation included 5% betadine to ocular surface, eyelid speculum. A (32g) needle was used.   Injection:  2 mg aflibercept Alfonse Flavors) SOLN   NDC: A3590391, Lot: [id number], Expiration date: 01/29/2020   Route: Intravitreal, Site: Right Eye, Waste: 0.05 mg  Post-op Post injection exam found visual acuity of at least counting fingers. The patient tolerated the procedure well. There were no complications. The patient received written and verbal post procedure care education.        Intravitreal Injection, Pharmacologic Agent - OS - Left Eye       Time Out 08/14/2019. 10:29 AM. Confirmed correct patient, procedure, site, and patient consented.    Anesthesia Topical anesthesia was used. Anesthetic medications included Lidocaine 2%, Proparacaine 0.5%.   Procedure Preparation included 5% betadine to ocular surface, eyelid speculum. A supplied needle was used.   Injection:  1.25 mg Bevacizumab (AVASTIN) SOLN   NDC: 90240-973-53, Lot: [id number] , Expiration date: 09/29/2019   Route: Intravitreal, Site: Left Eye, Waste: 0 mL  Post-op Post injection exam found visual acuity of at least counting fingers. The patient tolerated the procedure well. There were no complications. The patient received written and verbal post procedure care education.                 ASSESSMENT/PLAN:    ICD-10-CM   1. Moderate nonproliferative diabetic retinopathy of both eyes with macular edema associated with type 2 diabetes mellitus (HCC)  10/12/2019 Intravitreal Injection, Pharmacologic Agent - OD - Right Eye    Intravitreal Injection, Pharmacologic Agent - OS - Left Eye    Bevacizumab (AVASTIN) SOLN 1.25 mg    aflibercept (EYLEA) SOLN 2 mg  2. Retinal edema  H35.81 OCT, Retina - OU - Both Eyes  3. Combined forms of age-related cataract of both eyes  H25.813   4. Ocular hypertension, bilateral  H40.053     1,2. Moderate Non-proliferative diabetic retinopathy, OU             - S/P IVA OD #1 (11.03.20), #2 (12.01.20), #3 (01.15.21), #4 (02.12.21)  - S/P IVA OS #1 (11.16.20), #2 (01.15.21), #3 (02.12.21)  - FA (11.16.2020) shows late leaking MA OU, mild peripheral nonperfusion OU, no NV OU  - exam shows scattered MA, IRH, clustered centrally OU  - OCT shows OD: Interval increase in central IRF; OS: Interval improvement in IRF/cystic changes  - discussed possible resistance to IVA OD and possible switch in medication  - recommend IVE OD #1 and IVA OS #4 today, 03.12.21  - pt wishes to proceed  - RBA of procedure discussed, questions answered  - informed consent obtained  - Avastin informed consent form signed and scanned on 01.15.2021  - Eylea  informed consent form signed and scanned on 03.12.2021  -  see procedure note  - Eylea4U benefits investigation started 02.12.21 -- approved for 2021 (+CoPay card)  - f/u 4 weeks -- DFE/OCT/possible injection OU  3. Mixed form age related cataract OU  - The symptoms of cataract, surgical options, and treatments and risks were discussed with patient.  - discussed diagnosis and progression  - not yet visually significant  - monitor for now  4. Ocular Hypertension OU  - IOP today 17,17  - pt self d/c'd brim BID OU about a month ago  - ok to continue to hold   Ophthalmic Meds Ordered this visit:  Meds ordered this encounter  Medications  . Bevacizumab (AVASTIN) SOLN 1.25 mg  . aflibercept (EYLEA) SOLN 2 mg   Return in about 4 weeks (around 09/11/2019) for f/u NPDR OU, DFE, OCT.  There are no Patient Instructions on file for this visit.  Explained the diagnoses, plan, and follow up with the patient and they expressed understanding.  Patient expressed understanding of the importance of proper follow up care.    This document serves as a record of services personally performed by Karie Chimera, MD, PhD. It was created on their behalf by Laurian Brim, OA, an ophthalmic assistant. The creation of this record is the provider's dictation and/or activities during the visit.    Electronically signed by: Laurian Brim, OA 03.09.2021 11:46 AM   Karie Chimera, M.D., Ph.D. Diseases & Surgery of the Retina and Vitreous [facility]  I have reviewed the above documentation for accuracy and completeness, and I agree with the above. Karie Chimera, M.D., Ph.D. 08/14/19 11:46 AM   Abbreviations: M myopia (nearsighted); A astigmatism; H hyperopia (farsighted); P presbyopia; Mrx spectacle prescription;  CTL contact lenses; OD right eye; OS left eye; OU both eyes  XT exotropia; ET esotropia; PEK punctate epithelial keratitis; PEE punctate epithelial erosions; DES dry eye  syndrome; MGD meibomian gland dysfunction; ATs artificial tears; PFAT's preservative free artificial tears; NSC nuclear sclerotic cataract; PSC posterior subcapsular cataract; ERM epi-retinal membrane; PVD posterior vitreous detachment; RD retinal detachment; DM diabetes mellitus; DR diabetic retinopathy; NPDR non-proliferative diabetic retinopathy; PDR proliferative diabetic retinopathy; CSME clinically significant macular edema; DME diabetic macular edema; dbh dot blot hemorrhages; CWS cotton wool spot; POAG primary open angle glaucoma; C/D cup-to-disc ratio; HVF humphrey visual field; GVF goldmann visual field; OCT optical coherence tomography; IOP intraocular pressure; BRVO Branch retinal vein occlusion; CRVO central retinal vein occlusion; CRAO central retinal artery occlusion; BRAO branch retinal artery occlusion; RT retinal tear; SB scleral buckle; PPV pars plana vitrectomy; VH Vitreous hemorrhage; PRP panretinal laser photocoagulation; IVK intravitreal kenalog; VMT vitreomacular traction; MH Macular hole;  NVD neovascularization of the disc; NVE neovascularization elsewhere; AREDS age related eye disease study; ARMD age related macular degeneration; POAG primary open angle glaucoma; EBMD epithelial/anterior basement membrane dystrophy; ACIOL anterior chamber intraocular lens; IOL intraocular lens; PCIOL posterior chamber intraocular lens; Phaco/IOL phacoemulsification with intraocular lens placement; PRK photorefractive keratectomy; LASIK laser assisted in situ keratomileusis; HTN hypertension; DM diabetes mellitus; COPD chronic obstructive pulmonary disease

## 2019-08-14 ENCOUNTER — Other Ambulatory Visit: Payer: Self-pay

## 2019-08-14 ENCOUNTER — Ambulatory Visit (INDEPENDENT_AMBULATORY_CARE_PROVIDER_SITE_OTHER): Payer: BC Managed Care – PPO | Admitting: Ophthalmology

## 2019-08-14 ENCOUNTER — Encounter (INDEPENDENT_AMBULATORY_CARE_PROVIDER_SITE_OTHER): Payer: Self-pay | Admitting: Ophthalmology

## 2019-08-14 DIAGNOSIS — H3581 Retinal edema: Secondary | ICD-10-CM

## 2019-08-14 DIAGNOSIS — H25813 Combined forms of age-related cataract, bilateral: Secondary | ICD-10-CM

## 2019-08-14 DIAGNOSIS — H40053 Ocular hypertension, bilateral: Secondary | ICD-10-CM

## 2019-08-14 DIAGNOSIS — E113313 Type 2 diabetes mellitus with moderate nonproliferative diabetic retinopathy with macular edema, bilateral: Secondary | ICD-10-CM | POA: Diagnosis not present

## 2019-08-14 MED ORDER — AFLIBERCEPT 2MG/0.05ML IZ SOLN FOR KALEIDOSCOPE
2.0000 mg | INTRAVITREAL | Status: AC | PRN
  Administered 2019-08-14: 2 mg via INTRAVITREAL

## 2019-08-14 MED ORDER — BEVACIZUMAB CHEMO INJECTION 1.25MG/0.05ML SYRINGE FOR KALEIDOSCOPE
1.2500 mg | INTRAVITREAL | Status: AC | PRN
  Administered 2019-08-14: 1.25 mg via INTRAVITREAL

## 2019-09-11 ENCOUNTER — Encounter (INDEPENDENT_AMBULATORY_CARE_PROVIDER_SITE_OTHER): Payer: Self-pay | Admitting: Ophthalmology

## 2019-09-11 ENCOUNTER — Ambulatory Visit (INDEPENDENT_AMBULATORY_CARE_PROVIDER_SITE_OTHER): Payer: BC Managed Care – PPO | Admitting: Ophthalmology

## 2019-09-11 DIAGNOSIS — E113313 Type 2 diabetes mellitus with moderate nonproliferative diabetic retinopathy with macular edema, bilateral: Secondary | ICD-10-CM | POA: Diagnosis not present

## 2019-09-11 DIAGNOSIS — H40053 Ocular hypertension, bilateral: Secondary | ICD-10-CM

## 2019-09-11 DIAGNOSIS — H3581 Retinal edema: Secondary | ICD-10-CM

## 2019-09-11 DIAGNOSIS — H25813 Combined forms of age-related cataract, bilateral: Secondary | ICD-10-CM

## 2019-09-11 NOTE — Progress Notes (Signed)
[facility] - Clinic Note  09/11/2019  CHIEF COMPLAINT Patient presents for Retina Follow Up  HISTORY OF PRESENT ILLNESS: Derrick Terry is a 59 y.o. male who presents to the clinic today for:   HPI    Retina Follow Up    Patient presents with  Diabetic Retinopathy.  In both eyes.  This started weeks ago.  Severity is moderate.  Duration of weeks.  Since onset it is stable.  I, the attending physician,  performed the HPI with the patient and updated documentation appropriately.          Comments    Pt states his vision is about the same OU.  Pt denies eye pain or discomfort and denies any new or worsening floaters or fol OU.       Last edited by [name], MD on 09/11/2019  1:45 PM. (History)    pt states he is having a hard time focusing his vision, he states he is having to adjust his glasses up and down when reading, he states he can tell he did not do as well on the eye chart today  Referring physician: [name], MD [phone] [address] [place],  [place] [address]  HISTORICAL INFORMATION:   Selected notes from the MEDICAL RECORD NUMBER DEE - Referred by PCP, Dr. [name]   CURRENT MEDICATIONS: Current Outpatient Medications (Ophthalmic Drugs)  Medication Sig  . brimonidine (ALPHAGAN) 0.2 % ophthalmic solution Place 1 drop into both eyes 2 (two) times daily.   No current facility-administered medications for this visit. (Ophthalmic Drugs)   Current Outpatient Medications (Other)  Medication Sig  . atorvastatin (LIPITOR) 10 MG tablet Take 10 mg by mouth daily.  . B-D UF III MINI PEN NEEDLES 31G X 5 MM MISC USE TO ADMINISTER VICTOZA ONCE A DAY  . celecoxib (CELEBREX) 200 MG capsule Take 200 mg by mouth daily.  . CONTOUR NEXT TEST test strip USE STRIP TO CHECK GLUCOSE ONCE DAILY  . cyclobenzaprine (FLEXERIL) 10 MG tablet Take 1 tablet (10 mg total) by mouth 2 (two) times daily as needed.  . Empagliflozin-metFORMIN HCl (SYNJARDY  PO) Take by mouth.  . levocetirizine (XYZAL) 5 MG tablet Take 5 mg by mouth every evening.  . lidocaine (XYLOCAINE) 5 % ointment Apply topically as needed.  . Liraglutide (VICTOZA [place]) Inject into the skin daily.  . metFORMIN (GLUCOPHAGE) 1000 MG tablet Take 1,000 mg by mouth 2 (two) times daily with a meal.  . Microlet Lancets MISC USE TO CHECK GLUCOSE ONCE DAILY  . phenazopyridine (PYRIDIUM) 100 MG tablet Take 100 mg by mouth 3 (three) times daily as needed.  . pioglitazone (ACTOS) 45 MG tablet Take 45 mg by mouth daily.  . predniSONE (STERAPRED UNI-PAK 48 TAB) 5 MG (48) TBPK tablet See admin instructions.  . quinapril (ACCUPRIL) 10 MG tablet Take 10 mg by mouth daily.  . tadalafil (CIALIS) 20 MG tablet Take 20 mg by mouth daily as needed.  . valACYclovir (VALTREX) 1000 MG tablet SMARTSIG:2 Tablet(s) By Mouth Every 12 Hours PRN  . valACYclovir (VALTREX) 500 MG tablet Take 500 mg by mouth as needed.   Marland Kitchen VICTOZA 18 MG/3ML SOPN Inject 1.8 mg into the skin daily.   No current facility-administered medications for this visit. (Other)      REVIEW OF SYSTEMS: ROS    Positive for: Endocrine, Eyes   Negative for: Constitutional, Gastrointestinal, Neurological, Skin, Genitourinary, Musculoskeletal, HENT, Cardiovascular, Respiratory, Psychiatric, Allergic/Imm, Heme/Lymph  Last edited by Corrinne Eagle on 09/11/2019  1:15 PM. (History)       ALLERGIES Allergies  Allergen Reactions  . Toradol [Ketorolac Tromethamine]     itching    PAST MEDICAL HISTORY Past Medical History:  Diagnosis Date  . Diabetes mellitus without complication (HCC)   . Diabetic retinopathy (HCC)   . ED (erectile dysfunction)   . Hematuria   . Herpes labialis    Past Surgical History:  Procedure Laterality Date  . KNEE ARTHROSCOPY Left   . KNEE ARTHROSCOPY Right     FAMILY HISTORY History reviewed. No pertinent family history.  SOCIAL HISTORY Social History   Tobacco Use  . Smoking status: Never  Smoker  . Smokeless tobacco: Never Used  Substance Use Topics  . Alcohol use: No  . Drug use: No         OPHTHALMIC EXAM:  Base Eye Exam    Visual Acuity (Snellen - Linear)      Right Left   Dist [place] 20/50 -2 20/50 -1   Dist ph [place] NI 20/30 -1       Tonometry (Tonopen, 1:19 PM)      Right Left   Pressure 19 16       Pupils      Dark Light Shape React APD   Right 3 2 Round Minimal 0   Left 3 2 Round Minimal 0       Visual Fields      Left Right    Full Full       Extraocular Movement      Right Left    Full Full       Neuro/Psych    Oriented x3: Yes   Mood/Affect: Normal       Dilation    Both eyes: 1.0% Mydriacyl, 2.5% Phenylephrine @ 1:19 PM        Slit Lamp and Fundus Exam    Slit Lamp Exam      Right Left   Lids/Lashes Mild Meibomian gland dysfunction Mild Meibomian gland dysfunction   Conjunctiva/Sclera Mild Melanosis Mild Melanosis   Cornea Trace Punctate epithelial erosions, Debris in tear film Trace Punctate epithelial erosions, Debris in tear film   Anterior Chamber Deep and clear, narrow temporal angle Deep and clear, narrow temporal angle   Iris Round and dilated, No NVI Round and dilated, No NVI   Lens 2+ Nuclear sclerosis, 2-3+ Cortical cataract 2-3+ Nuclear sclerosis, 3+ Cortical cataract   Vitreous Vitreous syneresis Vitreous syneresis       Fundus Exam      Right Left   Disc Mild Pallor, Sharp rim Mild Pallor, Sharp rim   C/D Ratio 0.5 0.5   Macula Blunted foveal reflex, persistent central cystic changes -- slightly improved, scattered Microaneurysms / IRH with cluster just IN to fovea -- slightly improved, focal pigment clump ST macula Good foveal reflex, persistent cystic changes, scattered Microaneurysms and IRH   Vessels Vascular attenuation, mild Tortuousity Vascular attenuation, mild Tortuousity   Periphery Attached, scattered MA  Attached, large pigmented CR scar IT periphery, scattered MA / IRH          IMAGING AND  PROCEDURES  Imaging and Procedures for @TODAY @  OCT, Retina - OU - Both Eyes       Right Eye Quality was good. Central Foveal Thickness: 441. Progression has improved. Findings include abnormal foveal contour, intraretinal fluid, no SRF, intraretinal hyper-reflective material (Mild interval improvement in IRF).   Left Eye Quality  was good. Central Foveal Thickness: 290. Progression has improved. Findings include normal foveal contour, intraretinal fluid, no SRF (Mild Interval improvement in inferior IRF).   Notes *Images captured and stored on drive  Diagnosis / Impression:  +DME OU (OD>OS) OD: Mild interval improvement in IRF OS: Mild Interval improvement in inferior IRF   Clinical management:  See below  Abbreviations: NFP - Normal foveal profile. CME - cystoid macular edema. PED - pigment epithelial detachment. IRF - intraretinal fluid. SRF - subretinal fluid. EZ - ellipsoid zone. ERM - epiretinal membrane. ORA - outer retinal atrophy. ORT - outer retinal tubulation. SRHM - subretinal hyper-reflective material        Intravitreal Injection, Pharmacologic Agent - OD - Right Eye       Time Out 09/11/2019. 1:18 PM. Confirmed correct patient, procedure, site, and patient consented.   Anesthesia Topical anesthesia was used. Anesthetic medications included Lidocaine 2%, Proparacaine 0.5%.   Procedure Preparation included 5% betadine to ocular surface, eyelid speculum. A (32g) needle was used.   Injection:  2 mg aflibercept Alfonse Flavors) SOLN   NDC: A3590391, Lot: [id number], Expiration date: 02/29/2020   Route: Intravitreal, Site: Right Eye, Waste: 0.05 mL  Post-op Post injection exam found visual acuity of at least counting fingers. The patient tolerated the procedure well. There were no complications. The patient received written and verbal post procedure care education.        Intravitreal Injection, Pharmacologic Agent - OS - Left Eye       Time Out 09/11/2019. 2:14  PM. Confirmed correct patient, procedure, site, and patient consented.   Anesthesia Topical anesthesia was used. Anesthetic medications included Lidocaine 2%, Proparacaine 0.5%.   Procedure Preparation included 5% betadine to ocular surface, eyelid speculum. A (32g) needle was used.   Injection:  1.25 mg Bevacizumab (AVASTIN) SOLN   NDC: 25427-062-37, Lot: [id number] , Expiration date: 12/17/2019   Route: Intravitreal, Site: Left Eye, Waste: 0 mL  Post-op Post injection exam found visual acuity of at least counting fingers. The patient tolerated the procedure well. There were no complications. The patient received written and verbal post procedure care education.                 ASSESSMENT/PLAN:    ICD-10-CM   1. Moderate nonproliferative diabetic retinopathy of both eyes with macular edema associated with type 2 diabetes mellitus (HCC)  12/30/2019 Intravitreal Injection, Pharmacologic Agent - OD - Right Eye    Intravitreal Injection, Pharmacologic Agent - OS - Left Eye    aflibercept (EYLEA) SOLN 2 mg    Bevacizumab (AVASTIN) SOLN 1.25 mg  2. Retinal edema  H35.81 OCT, Retina - OU - Both Eyes  3. Combined forms of age-related cataract of both eyes  H25.813   4. Ocular hypertension, bilateral  H40.053     1,2. Moderate Non-proliferative diabetic retinopathy, OU             - S/P IVA OD #1 (11.03.20), #2 (12.01.20), #3 (01.15.21), #4 (02.12.21)  - S/P IVA OS #1 (11.16.20), #2 (01.15.21), #3 (02.12.21), #4 (03.12.21)  - discussed possible resistance to IVA OD and possible switch in medication  - S/P IVE OD #1 (03.12.21)  - FA (11.16.2020) shows late leaking MA OU, mild peripheral nonperfusion OU, no NV OU  - exam shows scattered MA, IRH, clustered centrally OU  - OCT shows Interval improvement in IRF OU  - recommend IVE OD #2 and IVA OS #5 today, 04.09.21  - pt wishes to proceed  - RBA  of procedure discussed, questions answered  - informed consent obtained  - Avastin  informed consent form signed and scanned on 01.15.2021  - Eylea informed consent form signed and scanned on 03.12.2021  - see procedure note  - Eylea4U benefits investigation started 02.12.21 -- approved for 2021 (+CoPay card)  - f/u 4 weeks -- DFE/OCT/possible injection OU, consider focal laser OS at next visit  3. Mixed form age related cataract OU  - The symptoms of cataract, surgical options, and treatments and risks were discussed with patient.  - discussed diagnosis and progression  - not yet visually significant  - monitor for now  4. Ocular Hypertension OU  - IOP today 19,16  - pt self d/c'd brim BID OU   - ok to continue to hold   Ophthalmic Meds Ordered this visit:  Meds ordered this encounter  Medications  . aflibercept (EYLEA) SOLN 2 mg  . Bevacizumab (AVASTIN) SOLN 1.25 mg   Return in about 4 weeks (around 10/09/2019) for f/u NPDR OU, DFE, OCT.  There are no Patient Instructions on file for this visit.  Explained the diagnoses, plan, and follow up with the patient and they expressed understanding.  Patient expressed understanding of the importance of proper follow up care.    This document serves as a record of services personally performed by Karie Chimera, MD, PhD. It was created on their behalf by Laurian Brim, OA, an ophthalmic assistant. The creation of this record is the provider's dictation and/or activities during the visit.    Electronically signed by: Laurian Brim, OA 04.09.2021 12:46 AM   Karie Chimera, M.D., Ph.D. Diseases & Surgery of the Retina and Vitreous [facility]  I have reviewed the above documentation for accuracy and completeness, and I agree with the above. Karie Chimera, M.D., Ph.D. 09/13/19 12:46 AM    Abbreviations: M myopia (nearsighted); A astigmatism; H hyperopia (farsighted); P presbyopia; Mrx spectacle prescription;  CTL contact lenses; OD right eye; OS left eye; OU both eyes  XT exotropia; ET  esotropia; PEK punctate epithelial keratitis; PEE punctate epithelial erosions; DES dry eye syndrome; MGD meibomian gland dysfunction; ATs artificial tears; PFAT's preservative free artificial tears; NSC nuclear sclerotic cataract; PSC posterior subcapsular cataract; ERM epi-retinal membrane; PVD posterior vitreous detachment; RD retinal detachment; DM diabetes mellitus; DR diabetic retinopathy; NPDR non-proliferative diabetic retinopathy; PDR proliferative diabetic retinopathy; CSME clinically significant macular edema; DME diabetic macular edema; dbh dot blot hemorrhages; CWS cotton wool spot; POAG primary open angle glaucoma; C/D cup-to-disc ratio; HVF humphrey visual field; GVF goldmann visual field; OCT optical coherence tomography; IOP intraocular pressure; BRVO Branch retinal vein occlusion; CRVO central retinal vein occlusion; CRAO central retinal artery occlusion; BRAO branch retinal artery occlusion; RT retinal tear; SB scleral buckle; PPV pars plana vitrectomy; VH Vitreous hemorrhage; PRP panretinal laser photocoagulation; IVK intravitreal kenalog; VMT vitreomacular traction; MH Macular hole;  NVD neovascularization of the disc; NVE neovascularization elsewhere; AREDS age related eye disease study; ARMD age related macular degeneration; POAG primary open angle glaucoma; EBMD epithelial/anterior basement membrane dystrophy; ACIOL anterior chamber intraocular lens; IOL intraocular lens; PCIOL posterior chamber intraocular lens; Phaco/IOL phacoemulsification with intraocular lens placement; PRK photorefractive keratectomy; LASIK laser assisted in situ keratomileusis; HTN hypertension; DM diabetes mellitus; COPD chronic obstructive pulmonary disease

## 2019-09-13 MED ORDER — BEVACIZUMAB CHEMO INJECTION 1.25MG/0.05ML SYRINGE FOR KALEIDOSCOPE
1.2500 mg | INTRAVITREAL | Status: AC | PRN
  Administered 2019-09-13: 01:00:00 1.25 mg via INTRAVITREAL

## 2019-09-13 MED ORDER — AFLIBERCEPT 2MG/0.05ML IZ SOLN FOR KALEIDOSCOPE
2.0000 mg | INTRAVITREAL | Status: AC | PRN
  Administered 2019-09-13: 2 mg via INTRAVITREAL

## 2019-09-18 DIAGNOSIS — E785 Hyperlipidemia, unspecified: Secondary | ICD-10-CM | POA: Diagnosis not present

## 2019-09-18 DIAGNOSIS — E119 Type 2 diabetes mellitus without complications: Secondary | ICD-10-CM | POA: Diagnosis not present

## 2019-09-18 DIAGNOSIS — Z5181 Encounter for therapeutic drug level monitoring: Secondary | ICD-10-CM | POA: Diagnosis not present

## 2019-09-18 DIAGNOSIS — Z832 Family history of diseases of the blood and blood-forming organs and certain disorders involving the immune mechanism: Secondary | ICD-10-CM | POA: Diagnosis not present

## 2019-09-18 DIAGNOSIS — D539 Nutritional anemia, unspecified: Secondary | ICD-10-CM | POA: Diagnosis not present

## 2019-10-09 ENCOUNTER — Encounter (INDEPENDENT_AMBULATORY_CARE_PROVIDER_SITE_OTHER): Payer: BC Managed Care – PPO | Admitting: Ophthalmology

## 2019-10-13 ENCOUNTER — Encounter (INDEPENDENT_AMBULATORY_CARE_PROVIDER_SITE_OTHER): Payer: BC Managed Care – PPO | Admitting: Ophthalmology

## 2019-10-13 NOTE — Progress Notes (Addendum)
[facility] Note  10/15/2019  CHIEF COMPLAINT Patient presents for Retina Follow Up  HISTORY OF PRESENT ILLNESS: Derrick Terry is a 59 y.o. male who presents to the clinic today for:   HPI    Retina Follow Up    Patient presents with  Diabetic Retinopathy.  In both eyes.  This started weeks ago.  Severity is moderate.  Duration of weeks.  Since onset it is stable.  I, the attending physician,  performed the HPI with the patient and updated documentation appropriately.          Comments    Pt states his vision is about the same.  Denies eye pain or discomfort and denies any new or worsening floaters or fol OU.       Last edited by Bernarda Caffey, MD on 10/15/2019  8:37 PM. (History)    pt states   Referring physician: Shirline Frees, MD [place],  [place] [id number]  HISTORICAL INFORMATION:   Selected notes from the [facility] - Referred by PCP, Dr. Shirline Frees   CURRENT MEDICATIONS: Current Outpatient Medications (Ophthalmic Drugs)  Medication Sig  . brimonidine (ALPHAGAN) 0.2 % ophthalmic solution Place 1 drop into both eyes 2 (two) times daily.   No current facility-administered medications for this visit. (Ophthalmic Drugs)   Current Outpatient Medications (Other)  Medication Sig  . atorvastatin (LIPITOR) 10 MG tablet Take 10 mg by mouth daily.  . B-D UF III MINI PEN NEEDLES 31G X 5 MM MISC USE TO ADMINISTER VICTOZA ONCE A DAY  . celecoxib (CELEBREX) 200 MG capsule Take 200 mg by mouth daily.  . CONTOUR NEXT TEST test strip USE STRIP TO CHECK GLUCOSE ONCE DAILY  . cyclobenzaprine (FLEXERIL) 10 MG tablet Take 1 tablet (10 mg total) by mouth 2 (two) times daily as needed.  . Empagliflozin-metFORMIN HCl (SYNJARDY PO) Take by mouth.  . levocetirizine (XYZAL) 5 MG tablet Take 5 mg by mouth every evening.  . lidocaine (XYLOCAINE) 5 % ointment Apply topically as needed.  . Liraglutide (VICTOZA )  Inject into the skin daily.  . metFORMIN (GLUCOPHAGE) 1000 MG tablet Take 1,000 mg by mouth 2 (two) times daily with a meal.  . Microlet Lancets MISC USE TO CHECK GLUCOSE ONCE DAILY  . phenazopyridine (PYRIDIUM) 100 MG tablet Take 100 mg by mouth 3 (three) times daily as needed.  . pioglitazone (ACTOS) 45 MG tablet Take 45 mg by mouth daily.  . predniSONE (STERAPRED UNI-PAK 48 TAB) 5 MG (48) TBPK tablet See admin instructions.  . quinapril (ACCUPRIL) 10 MG tablet Take 10 mg by mouth daily.  . tadalafil (CIALIS) 20 MG tablet Take 20 mg by mouth daily as needed.  . valACYclovir (VALTREX) 1000 MG tablet SMARTSIG:2 Tablet(s) By Mouth Every 12 Hours PRN  . valACYclovir (VALTREX) 500 MG tablet Take 500 mg by mouth as needed.   Marland Kitchen VICTOZA 18 MG/3ML SOPN Inject 1.8 mg into the skin daily.   No current facility-administered medications for this visit. (Other)      REVIEW OF SYSTEMS: ROS    Positive for: Endocrine, Eyes   Negative for: Constitutional, Gastrointestinal, Neurological, Skin, Genitourinary, Musculoskeletal, HENT, Cardiovascular, Respiratory, Psychiatric, Allergic/Imm, Heme/Lymph   Last edited by Doneen Poisson on 10/15/2019  9:51 AM. (History)       ALLERGIES Allergies  Allergen Reactions  . Toradol [Ketorolac Tromethamine]     itching    PAST MEDICAL HISTORY  Past Medical History:  Diagnosis Date  . Diabetes mellitus without complication (HCC)   . Diabetic retinopathy (HCC)   . ED (erectile dysfunction)   . Hematuria   . Herpes labialis    Past Surgical History:  Procedure Laterality Date  . KNEE ARTHROSCOPY Left   . KNEE ARTHROSCOPY Right     FAMILY HISTORY History reviewed. No pertinent family history.  SOCIAL HISTORY Social History   Tobacco Use  . Smoking status: Never Smoker  . Smokeless tobacco: Never Used  Substance Use Topics  . Alcohol use: No  . Drug use: No         OPHTHALMIC EXAM:  Base Eye Exam    Visual Acuity (Snellen - Linear)       Right Left   Dist [place] 20/50 -1 20/40 -2   Dist ph [place] 20/40 -2        Tonometry (Tonopen, 9:56 AM)      Right Left   Pressure 21 21       Pupils      Dark Light Shape React APD   Right 2 1 Round Minimal 0   Left 2 1 Round Minimal 0       Visual Fields      Left Right    Full Full       Extraocular Movement      Right Left    Full Full       Neuro/Psych    Oriented x3: Yes   Mood/Affect: Normal       Dilation    Both eyes: 1.0% Mydriacyl, 2.5% Phenylephrine @ 9:56 AM        Slit Lamp and Fundus Exam    Slit Lamp Exam      Right Left   Lids/Lashes Mild Meibomian gland dysfunction Mild Meibomian gland dysfunction   Conjunctiva/Sclera Mild Melanosis Mild Melanosis   Cornea Trace Punctate epithelial erosions, Debris in tear film Trace Punctate epithelial erosions, Debris in tear film   Anterior Chamber Deep and clear, narrow temporal angle Deep and clear, narrow temporal angle   Iris Round and dilated, No NVI Round and dilated, No NVI   Lens 2+ Nuclear sclerosis, 2-3+ Cortical cataract 2-3+ Nuclear sclerosis, 3+ Cortical cataract   Vitreous Vitreous syneresis Vitreous syneresis       Fundus Exam      Right Left   Disc Mild Pallor, Sharp rim Mild Pallor, Sharp rim   C/D Ratio 0.5 0.5   Macula Blunted foveal reflex, persistent central cystic changes -- slightly increased, scattered Microaneurysms / IRH greatest temporally, focal pigment clump ST macula Flat, Good foveal reflex, trace persistent cystic changes, scattered Microaneurysms and IRH   Vessels Vascular attenuation, mild Tortuousity Vascular attenuation, mild Tortuousity   Periphery Attached, scattered MA  Attached, large pigmented CR scar IT periphery, scattered MA / IRH          IMAGING AND PROCEDURES  Imaging and Procedures for @TODAY @  OCT, Retina - OU - Both Eyes       Right Eye Quality was good. Central Foveal Thickness: 451. Progression has worsened. Findings include abnormal foveal  contour, intraretinal fluid, no SRF, intraretinal hyper-reflective material (Mild interval increase in IRF).   Left Eye Quality was good. Central Foveal Thickness: 292. Progression has been stable. Findings include normal foveal contour, intraretinal fluid, no SRF (Stable improvement in inferior IRF).   Notes *Images captured and stored on drive  Diagnosis / Impression:  +DME OU (OD>OS) OD: Mild interval  increase in IRF OS: stable improvement in inferior IRF   Clinical management:  See below  Abbreviations: NFP - Normal foveal profile. CME - cystoid macular edema. PED - pigment epithelial detachment. IRF - intraretinal fluid. SRF - subretinal fluid. EZ - ellipsoid zone. ERM - epiretinal membrane. ORA - outer retinal atrophy. ORT - outer retinal tubulation. SRHM - subretinal hyper-reflective material        Intravitreal Injection, Pharmacologic Agent - OD - Right Eye       Time Out 10/15/2019. 11:41 AM. Confirmed correct patient, procedure, site, and patient consented.   Anesthesia Topical anesthesia was used. Anesthetic medications included Lidocaine 2%, Proparacaine 0.5%.   Procedure Preparation included 5% betadine to ocular surface, eyelid speculum. A (33g) needle was used.   Injection:  2 mg aflibercept Gretta Cool) SOLN   NDC: L6038910, Lot: [id number], Expiration date: 02/22/2020   Route: Intravitreal, Site: Right Eye, Waste: 0.05 mL  Post-op Post injection exam found visual acuity of at least counting fingers. The patient tolerated the procedure well. There were no complications. The patient received written and verbal post procedure care education.        Intravitreal Injection, Pharmacologic Agent - OS - Left Eye       Time Out 10/16/2019. 8:14 AM. Confirmed correct patient, procedure, site, and patient consented.   Anesthesia Topical anesthesia was used. Anesthetic medications included Lidocaine 2%, Proparacaine 0.5%.   Procedure Preparation included 5%  betadine to ocular surface, eyelid speculum. A (33g) needle was used.   Injection:  2 mg aflibercept Gretta Cool) SOLN   NDC: L6038910, Lot: [id number], Expiration date: 02/22/2020   Route: Intravitreal, Site: Left Eye, Waste: 0.05 mL  Post-op Post injection exam found visual acuity of at least counting fingers. The patient tolerated the procedure well. There were no complications. The patient received written and verbal post procedure care education.                 ASSESSMENT/PLAN:    ICD-10-CM   1. Moderate nonproliferative diabetic retinopathy of both eyes with macular edema associated with type 2 diabetes mellitus (HCC)  A26.3335 Intravitreal Injection, Pharmacologic Agent - OD - Right Eye    Intravitreal Injection, Pharmacologic Agent - OS - Left Eye    aflibercept (EYLEA) SOLN 2 mg    aflibercept (EYLEA) SOLN 2 mg    aflibercept (EYLEA) SOLN 2 mg    aflibercept (EYLEA) SOLN 2 mg  2. Retinal edema  H35.81 OCT, Retina - OU - Both Eyes  3. Combined forms of age-related cataract of both eyes  H25.813   4. Ocular hypertension, bilateral  H40.053   5. Hypertensive retinopathy of both eyes  H35.033     1,2. Moderate Non-proliferative diabetic retinopathy, OU             - S/P IVA OD #1 (11.03.20), #2 (12.01.20), #3 (01.15.21), #4 (02.12.21)  - S/P IVA OS #1 (11.16.20), #2 (01.15.21), #3 (02.12.21), #4 (03.12.21), #5 (04.09.21)  - discussed possible resistance to IVA OD and possible switch in medication  - S/P IVE OD #1 (03.12.21), #2 (04.09.21)  - FA (11.16.2020) shows late leaking MA OU, mild peripheral nonperfusion OU, no NV OU  - exam shows scattered MA, IRH, clustered centrally OU  - OCT shows Interval improvement in IRF OU  - recommend IVE OD #3 and IVE OS #1 today, 05.13.21  - pt wishes to proceed  - RBA of procedure discussed, questions answered  - informed consent obtained  - Avastin informed consent  form signed and scanned on 01.15.2021  - Eylea informed consent  form signed and scanned on 03.12.2021  - see procedure note  - Eylea4U benefits investigation started 02.12.21 -- approved for 2021 (+CoPay card)  - f/u 4 weeks -- DFE/OCT/possible injection OU, consider focal laser OS at next visit  3. Mixed form age related cataract OU  - The symptoms of cataract, surgical options, and treatments and risks were discussed with patient.  - discussed diagnosis and progression  - not yet visually significant  - monitor for now  4. Ocular Hypertension OU  - IOP today 19,16  - pt self d/c'd brim BID OU   - ok to continue to hold   Ophthalmic Meds Ordered this visit:  Meds ordered this encounter  Medications  . aflibercept (EYLEA) SOLN 2 mg  . aflibercept (EYLEA) SOLN 2 mg  . aflibercept (EYLEA) SOLN 2 mg  . aflibercept (EYLEA) SOLN 2 mg   Return in about 4 weeks (around 11/12/2019) for f/u NPDR OU, DFE, OCT.  There are no Patient Instructions on file for this visit.  Explained the diagnoses, plan, and follow up with the patient and they expressed understanding.  Patient expressed understanding of the importance of proper follow up care.   This document serves as a record of services personally performed by Karie Chimera, MD, PhD. It was created on their behalf by Herby Abraham, COA, a certified ophthalmic assistant. The creation of this record is the provider's dictation and/or activities during the visit.    Electronically signed by: Herby Abraham, COA @TODAY @ 9:13 AM   This document serves as a record of services personally performed by , MD, PhD. It was created on their behalf by Karie Chimera, OA, an ophthalmic assistant. The creation of this record is the provider's dictation and/or activities during the visit.    Electronically signed by: Laurian Brim, OA 05.13.2021 9:13 AM   05.15.2021, M.D., Ph.D. Diseases & Surgery of the Retina and Vitreous [facility]  I have reviewed the above  documentation for accuracy and completeness, and I agree with the above. [facility], M.D., Ph.D. 10/26/19 9:13 AM     Abbreviations: M myopia (nearsighted); A astigmatism; H hyperopia (farsighted); P presbyopia; Mrx spectacle prescription;  CTL contact lenses; OD right eye; OS left eye; OU both eyes  XT exotropia; ET esotropia; PEK punctate epithelial keratitis; PEE punctate epithelial erosions; DES dry eye syndrome; MGD meibomian gland dysfunction; ATs artificial tears; PFAT's preservative free artificial tears; NSC nuclear sclerotic cataract; PSC posterior subcapsular cataract; ERM epi-retinal membrane; PVD posterior vitreous detachment; RD retinal detachment; DM diabetes mellitus; DR diabetic retinopathy; NPDR non-proliferative diabetic retinopathy; PDR proliferative diabetic retinopathy; CSME clinically significant macular edema; DME diabetic macular edema; dbh dot blot hemorrhages; CWS cotton wool spot; POAG primary open angle glaucoma; C/D cup-to-disc ratio; HVF humphrey visual field; GVF goldmann visual field; OCT optical coherence tomography; IOP intraocular pressure; BRVO Branch retinal vein occlusion; CRVO central retinal vein occlusion; CRAO central retinal artery occlusion; BRAO branch retinal artery occlusion; RT retinal tear; SB scleral buckle; PPV pars plana vitrectomy; VH Vitreous hemorrhage; PRP panretinal laser photocoagulation; IVK intravitreal kenalog; VMT vitreomacular traction; MH Macular hole;  NVD neovascularization of the disc; NVE neovascularization elsewhere; AREDS age related eye disease study; ARMD age related macular degeneration; POAG primary open angle glaucoma; EBMD epithelial/anterior basement membrane dystrophy; ACIOL anterior chamber intraocular lens; IOL intraocular lens; PCIOL posterior chamber intraocular lens; Phaco/IOL phacoemulsification with  intraocular lens placement; [place] photorefractive keratectomy; LASIK laser assisted in situ keratomileusis; HTN hypertension;  DM diabetes mellitus; COPD chronic obstructive pulmonary disease

## 2019-10-15 ENCOUNTER — Encounter (INDEPENDENT_AMBULATORY_CARE_PROVIDER_SITE_OTHER): Payer: Self-pay | Admitting: Ophthalmology

## 2019-10-15 ENCOUNTER — Other Ambulatory Visit: Payer: Self-pay

## 2019-10-15 ENCOUNTER — Ambulatory Visit (INDEPENDENT_AMBULATORY_CARE_PROVIDER_SITE_OTHER): Payer: BC Managed Care – PPO | Admitting: Ophthalmology

## 2019-10-15 DIAGNOSIS — H3581 Retinal edema: Secondary | ICD-10-CM | POA: Diagnosis not present

## 2019-10-15 DIAGNOSIS — E113313 Type 2 diabetes mellitus with moderate nonproliferative diabetic retinopathy with macular edema, bilateral: Secondary | ICD-10-CM

## 2019-10-15 DIAGNOSIS — H25813 Combined forms of age-related cataract, bilateral: Secondary | ICD-10-CM | POA: Diagnosis not present

## 2019-10-15 DIAGNOSIS — H40053 Ocular hypertension, bilateral: Secondary | ICD-10-CM

## 2019-10-15 DIAGNOSIS — H35033 Hypertensive retinopathy, bilateral: Secondary | ICD-10-CM

## 2019-10-26 MED ORDER — AFLIBERCEPT 2MG/0.05ML IZ SOLN FOR KALEIDOSCOPE
2.0000 mg | INTRAVITREAL | Status: AC | PRN
  Administered 2019-10-26: 2 mg via INTRAVITREAL

## 2019-10-26 MED ORDER — AFLIBERCEPT 2MG/0.05ML IZ SOLN FOR KALEIDOSCOPE
2.0000 mg | INTRAVITREAL | Status: AC | PRN
Start: 2019-10-15 — End: 2019-10-15
  Administered 2019-10-15: 2 mg via INTRAVITREAL

## 2019-10-26 MED ORDER — AFLIBERCEPT 2MG/0.05ML IZ SOLN FOR KALEIDOSCOPE
2.0000 mg | INTRAVITREAL | Status: AC | PRN
  Administered 2019-10-16: 2 mg via INTRAVITREAL

## 2019-11-10 NOTE — Progress Notes (Signed)
[facility] - Clinic Note  11/12/2019  CHIEF COMPLAINT Patient presents for Retina Follow Up  HISTORY OF PRESENT ILLNESS: Derrick Terry is a 59 y.o. male who presents to the clinic today for:   HPI    Retina Follow Up    Patient presents with  Diabetic Retinopathy.  In both eyes.  This started weeks ago.  Severity is moderate.  Duration of weeks.  Since onset it is stable.  I, the attending physician,  performed the HPI with the patient and updated documentation appropriately.          Comments    Pt states vision is the same OU.  Pt had one episode of pain in his right eye last week but only lasted a few minutes and has not had since then.  Pt denies any new or worsening floaters or fol OU.       Last edited by [name], MD on 11/12/2019  8:05 AM. (History)    pt states his vision "appears to be okay"  Referring physician: [name], MD [phone] [address] [place],  [place] [address]  HISTORICAL INFORMATION:   Selected notes from the MEDICAL RECORD NUMBER DEE - Referred by PCP, Dr. [name]   CURRENT MEDICATIONS: Current Outpatient Medications (Ophthalmic Drugs)  Medication Sig  . brimonidine (ALPHAGAN) 0.2 % ophthalmic solution Place 1 drop into both eyes 2 (two) times daily.   No current facility-administered medications for this visit. (Ophthalmic Drugs)   Current Outpatient Medications (Other)  Medication Sig  . atorvastatin (LIPITOR) 10 MG tablet Take 10 mg by mouth daily.  . B-D UF III MINI PEN NEEDLES 31G X 5 MM MISC USE TO ADMINISTER VICTOZA ONCE A DAY  . celecoxib (CELEBREX) 200 MG capsule Take 200 mg by mouth daily.  . CONTOUR NEXT TEST test strip USE STRIP TO CHECK GLUCOSE ONCE DAILY  . cyclobenzaprine (FLEXERIL) 10 MG tablet Take 1 tablet (10 mg total) by mouth 2 (two) times daily as needed.  . Empagliflozin-metFORMIN HCl (SYNJARDY PO) Take by mouth.  . levocetirizine (XYZAL) 5 MG tablet Take 5 mg by mouth  every evening.  . lidocaine (XYLOCAINE) 5 % ointment Apply topically as needed.  . Liraglutide (VICTOZA [place]) Inject into the skin daily.  . metFORMIN (GLUCOPHAGE) 1000 MG tablet Take 1,000 mg by mouth 2 (two) times daily with a meal.  . Microlet Lancets MISC USE TO CHECK GLUCOSE ONCE DAILY  . phenazopyridine (PYRIDIUM) 100 MG tablet Take 100 mg by mouth 3 (three) times daily as needed.  . pioglitazone (ACTOS) 45 MG tablet Take 45 mg by mouth daily.  . predniSONE (STERAPRED UNI-PAK 48 TAB) 5 MG (48) TBPK tablet See admin instructions.  . quinapril (ACCUPRIL) 10 MG tablet Take 10 mg by mouth daily.  . tadalafil (CIALIS) 20 MG tablet Take 20 mg by mouth daily as needed.  . valACYclovir (VALTREX) 1000 MG tablet SMARTSIG:2 Tablet(s) By Mouth Every 12 Hours PRN  . valACYclovir (VALTREX) 500 MG tablet Take 500 mg by mouth as needed.   Marland Kitchen VICTOZA 18 MG/3ML SOPN Inject 1.8 mg into the skin daily.   No current facility-administered medications for this visit. (Other)      REVIEW OF SYSTEMS: ROS    Positive for: Endocrine, Eyes   Negative for: Constitutional, Gastrointestinal, Neurological, Skin, Genitourinary, Musculoskeletal, HENT, Cardiovascular, Respiratory, Psychiatric, Allergic/Imm, Heme/Lymph   Last edited by Corrinne Eagle on 11/12/2019  7:57 AM. (History)  ALLERGIES Allergies  Allergen Reactions  . Toradol [Ketorolac Tromethamine]     itching    PAST MEDICAL HISTORY Past Medical History:  Diagnosis Date  . Diabetes mellitus without complication (HCC)   . Diabetic retinopathy (HCC)   . ED (erectile dysfunction)   . Hematuria   . Herpes labialis    Past Surgical History:  Procedure Laterality Date  . KNEE ARTHROSCOPY Left   . KNEE ARTHROSCOPY Right     FAMILY HISTORY History reviewed. No pertinent family history.  SOCIAL HISTORY Social History   Tobacco Use  . Smoking status: Never Smoker  . Smokeless tobacco: Never Used  Substance Use Topics  . Alcohol  use: No  . Drug use: No         OPHTHALMIC EXAM:  Base Eye Exam    Visual Acuity (Snellen - Linear)      Right Left   Dist [place] 20/50 -1 20/40 -2   Dist ph [place] 20/40 -2 20/30 -2       Tonometry (Tonopen, 8:02 AM)      Right Left   Pressure 24 24       Pupils      Dark Light Shape React APD   Right 2 1 Round Minimal 0   Left 2 1 Round Minimal 0       Visual Fields      Left Right    Full Full       Extraocular Movement      Right Left    Full Full       Neuro/Psych    Oriented x3: Yes   Mood/Affect: Normal       Dilation    Both eyes: 1.0% Mydriacyl, 2.5% Phenylephrine @ 8:02 AM        Slit Lamp and Fundus Exam    Slit Lamp Exam      Right Left   Lids/Lashes Mild Meibomian gland dysfunction Mild Meibomian gland dysfunction   Conjunctiva/Sclera Mild Melanosis Mild Melanosis   Cornea Trace Punctate epithelial erosions, Debris in tear film Trace Punctate epithelial erosions, Debris in tear film   Anterior Chamber Deep and clear, narrow temporal angle Deep and clear, narrow temporal angle   Iris Round and dilated, No NVI Round and dilated, No NVI   Lens 2+ Nuclear sclerosis, 2-3+ Cortical cataract 2-3+ Nuclear sclerosis, 3+ Cortical cataract   Vitreous Vitreous syneresis Vitreous syneresis       Fundus Exam      Right Left   Disc Mild Pallor, Sharp rim Mild Pallor, Sharp rim   C/D Ratio 0.5 0.5   Macula Blunted foveal reflex, persistent central cystic changes -- slightly improved, scattered Microaneurysms / IRH greatest temporally, focal pigment clump ST macula Flat, Good foveal reflex, trace persistent cystic changes inferiorly, scattered Microaneurysms and IRH   Vessels Vascular attenuation, mild Tortuousity Vascular attenuation, mild Tortuousity   Periphery Attached, scattered MA  Attached, large pigmented CR scar IT periphery, scattered MA / IRH          IMAGING AND PROCEDURES  Imaging and Procedures for @TODAY @  OCT, Retina - OU - Both Eyes        Right Eye Quality was good. Central Foveal Thickness: 422. Progression has improved. Findings include abnormal foveal contour, intraretinal fluid, no SRF, intraretinal hyper-reflective material (Mild interval improvement in IRF).   Left Eye Quality was good. Central Foveal Thickness: 287. Progression has been stable. Findings include normal foveal contour, intraretinal fluid, no SRF (Persistent cystic  changes inferior macula).   Notes *Images captured and stored on drive  Diagnosis / Impression:  +DME OU (OD>OS) OD: Mild interval improvement in IRF OS: Persistent cystic changes inferior macula  Clinical management:  See below  Abbreviations: NFP - Normal foveal profile. CME - cystoid macular edema. PED - pigment epithelial detachment. IRF - intraretinal fluid. SRF - subretinal fluid. EZ - ellipsoid zone. ERM - epiretinal membrane. ORA - outer retinal atrophy. ORT - outer retinal tubulation. SRHM - subretinal hyper-reflective material        Intravitreal Injection, Pharmacologic Agent - OD - Right Eye       Time Out 11/12/2019. 7:59 AM. Confirmed correct patient, procedure, site, and patient consented.   Anesthesia Topical anesthesia was used. Anesthetic medications included Lidocaine 2%, Proparacaine 0.5%.   Procedure Preparation included 5% betadine to ocular surface, eyelid speculum. A (32g) needle was used.   Injection:  2 mg aflibercept Alfonse Flavors) SOLN   NDC: A3590391, Lot: [id number], Expiration date: 02/29/2020   Route: Intravitreal, Site: Right Eye, Waste: 0.05 mL  Post-op Post injection exam found visual acuity of at least counting fingers. The patient tolerated the procedure well. There were no complications. The patient received written and verbal post procedure care education.        Intravitreal Injection, Pharmacologic Agent - OS - Left Eye       Time Out 11/12/2019. 7:59 AM. Confirmed correct patient, procedure, site, and patient consented.    Anesthesia Topical anesthesia was used. Anesthetic medications included Lidocaine 2%, Proparacaine 0.5%.   Procedure Preparation included 5% betadine to ocular surface, eyelid speculum. A (32g) needle was used.   Injection:  2 mg aflibercept Alfonse Flavors) SOLN   NDC: A3590391, Lot: [id number], Expiration date: 02/29/2020   Route: Intravitreal, Site: Left Eye, Waste: 0.05 mL  Post-op Post injection exam found visual acuity of at least counting fingers. The patient tolerated the procedure well. There were no complications. The patient received written and verbal post procedure care education.                 ASSESSMENT/PLAN:    ICD-10-CM   1. Moderate nonproliferative diabetic retinopathy of both eyes with macular edema associated with type 2 diabetes mellitus (HCC)  Y40.3474 Intravitreal Injection, Pharmacologic Agent - OD - Right Eye    Intravitreal Injection, Pharmacologic Agent - OS - Left Eye    aflibercept (EYLEA) SOLN 2 mg    aflibercept (EYLEA) SOLN 2 mg  2. Combined forms of age-related cataract of both eyes  H25.813   3. Retinal edema  H35.81 OCT, Retina - OU - Both Eyes  4. Hypertensive retinopathy of both eyes  H35.033   5. Ocular hypertension, bilateral  H40.053     1,2. Moderate Non-proliferative diabetic retinopathy, OU             - S/P IVA OD #1 (11.03.20), #2 (12.01.20), #3 (01.15.21), #4 (02.12.21)  - S/P IVA OS #1 (11.16.20), #2 (01.15.21), #3 (02.12.21), #4 (03.12.21), #5 (04.09.21)  - discussed possible resistance to IVA OD and possible switch in medication  - S/P IVE OD #1 (03.12.21), #2 (04.09.21), #3 (05.13.21)             - S/P IVE OS #1 (05.13.21)  - FA (11.16.2020) shows late leaking MA OU, mild peripheral nonperfusion OU, no NV OU  - exam shows scattered MA, IRH, clustered centrally OU  - OCT shows Interval improvement in IRF OD; OS: Persistent cystic changes inferior macula  - recommend IVE  OD #4 and IVE OS #2 today, 06.10.21  - pt wishes to  proceed  - RBA of procedure discussed, questions answered  - informed consent obtained  - Avastin informed consent form signed and scanned on 01.15.2021  - Eylea informed consent form signed and scanned on 03.12.2021  - see procedure note  - Eylea4U benefits investigation started 02.12.21 -- approved for 2021 (+CoPay card)  - f/u 4 weeks -- DFE/OCT/possible injection OU, consider focal laser OS at next visit  3. Mixed form age related cataract OU  - The symptoms of cataract, surgical options, and treatments and risks were discussed with patient.  - discussed diagnosis and progression  - not yet visually significant  - monitor for now  4. Ocular Hypertension OU  - IOP today 24 OU  - pt self d/c'd brim BID OU -- pt to restart brimonidine BID OU   Ophthalmic Meds Ordered this visit:  Meds ordered this encounter  Medications  . aflibercept (EYLEA) SOLN 2 mg  . aflibercept (EYLEA) SOLN 2 mg   Return in about 4 weeks (around 12/10/2019) for f/u NPDR OU, DFE, OCT.  There are no Patient Instructions on file for this visit.  Explained the diagnoses, plan, and follow up with the patient and they expressed understanding.  Patient expressed understanding of the importance of proper follow up care.   This document serves as a record of services personally performed by Karie Chimera, MD, PhD. It was created on their behalf by Herby Abraham, COA, a certified ophthalmic assistant. The creation of this record is the provider's dictation and/or activities during the visit.    Electronically signed by: Herby Abraham, COA @TODAY @ 10:45 PM  This document serves as a record of services personally performed by , MD, PhD. It was created on their behalf by Karie Chimera, OA, an ophthalmic assistant. The creation of this record is the provider's dictation and/or activities during the visit.    Electronically signed by: Laurian Brim, OA 06.10.2021 10:45 PM  08.10.2021, M.D.,  Ph.D. Diseases & Surgery of the Retina and Vitreous [facility]  I have reviewed the above documentation for accuracy and completeness, and I agree with the above. [facility], M.D., Ph.D. 11/12/19 10:45 PM   Abbreviations: M myopia (nearsighted); A astigmatism; H hyperopia (farsighted); P presbyopia; Mrx spectacle prescription;  CTL contact lenses; OD right eye; OS left eye; OU both eyes  XT exotropia; ET esotropia; PEK punctate epithelial keratitis; PEE punctate epithelial erosions; DES dry eye syndrome; MGD meibomian gland dysfunction; ATs artificial tears; PFAT's preservative free artificial tears; NSC nuclear sclerotic cataract; PSC posterior subcapsular cataract; ERM epi-retinal membrane; PVD posterior vitreous detachment; RD retinal detachment; DM diabetes mellitus; DR diabetic retinopathy; NPDR non-proliferative diabetic retinopathy; PDR proliferative diabetic retinopathy; CSME clinically significant macular edema; DME diabetic macular edema; dbh dot blot hemorrhages; CWS cotton wool spot; POAG primary open angle glaucoma; C/D cup-to-disc ratio; HVF humphrey visual field; GVF goldmann visual field; OCT optical coherence tomography; IOP intraocular pressure; BRVO Branch retinal vein occlusion; CRVO central retinal vein occlusion; CRAO central retinal artery occlusion; BRAO branch retinal artery occlusion; RT retinal tear; SB scleral buckle; PPV pars plana vitrectomy; VH Vitreous hemorrhage; PRP panretinal laser photocoagulation; IVK intravitreal kenalog; VMT vitreomacular traction; MH Macular hole;  NVD neovascularization of the disc; NVE neovascularization elsewhere; AREDS age related eye disease study; ARMD age related macular degeneration; POAG primary open angle glaucoma; EBMD epithelial/anterior basement membrane dystrophy; ACIOL anterior  chamber intraocular lens; IOL intraocular lens; PCIOL posterior chamber intraocular lens; Phaco/IOL phacoemulsification with  intraocular lens placement; [place] photorefractive keratectomy; LASIK laser assisted in situ keratomileusis; HTN hypertension; DM diabetes mellitus; COPD chronic obstructive pulmonary disease

## 2019-11-12 ENCOUNTER — Ambulatory Visit (INDEPENDENT_AMBULATORY_CARE_PROVIDER_SITE_OTHER): Payer: BC Managed Care – PPO | Admitting: Ophthalmology

## 2019-11-12 ENCOUNTER — Encounter (INDEPENDENT_AMBULATORY_CARE_PROVIDER_SITE_OTHER): Payer: Self-pay | Admitting: Ophthalmology

## 2019-11-12 ENCOUNTER — Other Ambulatory Visit: Payer: Self-pay

## 2019-11-12 DIAGNOSIS — H40053 Ocular hypertension, bilateral: Secondary | ICD-10-CM

## 2019-11-12 DIAGNOSIS — H3581 Retinal edema: Secondary | ICD-10-CM

## 2019-11-12 DIAGNOSIS — E113313 Type 2 diabetes mellitus with moderate nonproliferative diabetic retinopathy with macular edema, bilateral: Secondary | ICD-10-CM | POA: Diagnosis not present

## 2019-11-12 DIAGNOSIS — H25813 Combined forms of age-related cataract, bilateral: Secondary | ICD-10-CM

## 2019-11-12 DIAGNOSIS — H35033 Hypertensive retinopathy, bilateral: Secondary | ICD-10-CM | POA: Diagnosis not present

## 2019-11-12 MED ORDER — AFLIBERCEPT 2MG/0.05ML IZ SOLN FOR KALEIDOSCOPE
2.0000 mg | INTRAVITREAL | Status: AC | PRN
  Administered 2019-11-12: 2 mg via INTRAVITREAL

## 2019-12-15 NOTE — Progress Notes (Signed)
[facility] - Clinic Note  12/17/2019  CHIEF COMPLAINT Patient presents for Retina Follow Up  HISTORY OF PRESENT ILLNESS: Derrick Terry is a 59 y.o. male who presents to the clinic today for:   HPI    Retina Follow Up    Patient presents with  Diabetic Retinopathy.  In both eyes.  This started weeks ago.  Severity is moderate.  Duration of weeks.  Since onset it is stable.  I, the attending physician,  performed the HPI with the patient and updated documentation appropriately.          Comments    Pt states vision is about the same.  A few weeks ago, patient had cloudy va OD and irritation OD, resolved with artificial tears.  Patient denies eye pain or discomfort OU and denies any new or worsening floaters or fol OU.       Last edited by [name], MD on 12/17/2019 10:52 AM. (History)    pt feels like he scratched his eye a couple days after his last injection, he states he used AT's and they helped, he states his blood sugar and blood pressure have been good  Referring physician: [name], MD [phone] [address] [place],  [place] [address]  HISTORICAL INFORMATION:   Selected notes from the MEDICAL RECORD NUMBER DEE - Referred by PCP, Dr. [name]   CURRENT MEDICATIONS: Current Outpatient Medications (Ophthalmic Drugs)  Medication Sig  . brimonidine (ALPHAGAN) 0.2 % ophthalmic solution Place 1 drop into both eyes 2 (two) times daily.   No current facility-administered medications for this visit. (Ophthalmic Drugs)   Current Outpatient Medications (Other)  Medication Sig  . atorvastatin (LIPITOR) 10 MG tablet Take 10 mg by mouth daily.  . B-D UF III MINI PEN NEEDLES 31G X 5 MM MISC USE TO ADMINISTER VICTOZA ONCE A DAY  . celecoxib (CELEBREX) 200 MG capsule Take 200 mg by mouth daily.  . CONTOUR NEXT TEST test strip USE STRIP TO CHECK GLUCOSE ONCE DAILY  . cyclobenzaprine (FLEXERIL) 10 MG tablet Take 1 tablet (10 mg total) by  mouth 2 (two) times daily as needed.  . Empagliflozin-metFORMIN HCl (SYNJARDY PO) Take by mouth.  . levocetirizine (XYZAL) 5 MG tablet Take 5 mg by mouth every evening.  . lidocaine (XYLOCAINE) 5 % ointment Apply topically as needed.  . Liraglutide (VICTOZA [place]) Inject into the skin daily.  . metFORMIN (GLUCOPHAGE) 1000 MG tablet Take 1,000 mg by mouth 2 (two) times daily with a meal.  . Microlet Lancets MISC USE TO CHECK GLUCOSE ONCE DAILY  . phenazopyridine (PYRIDIUM) 100 MG tablet Take 100 mg by mouth 3 (three) times daily as needed.  . pioglitazone (ACTOS) 45 MG tablet Take 45 mg by mouth daily.  . predniSONE (STERAPRED UNI-PAK 48 TAB) 5 MG (48) TBPK tablet See admin instructions.  . quinapril (ACCUPRIL) 10 MG tablet Take 10 mg by mouth daily.  . tadalafil (CIALIS) 20 MG tablet Take 20 mg by mouth daily as needed.  . valACYclovir (VALTREX) 1000 MG tablet SMARTSIG:2 Tablet(s) By Mouth Every 12 Hours PRN  . valACYclovir (VALTREX) 500 MG tablet Take 500 mg by mouth as needed.   Marland Kitchen VICTOZA 18 MG/3ML SOPN Inject 1.8 mg into the skin daily.   No current facility-administered medications for this visit. (Other)      REVIEW OF SYSTEMS: ROS    Positive for: Endocrine, Eyes   Negative for: Constitutional, Gastrointestinal, Neurological, Skin, Genitourinary, Musculoskeletal,  HENT, Cardiovascular, Respiratory, Psychiatric, Allergic/Imm, Heme/Lymph   Last edited by Corrinne EagleEnglish, Ashley L on 12/17/2019  9:27 AM. (History)       ALLERGIES Allergies  Allergen Reactions  . Toradol [Ketorolac Tromethamine]     itching    PAST MEDICAL HISTORY Past Medical History:  Diagnosis Date  . Diabetes mellitus without complication (HCC)   . Diabetic retinopathy (HCC)   . ED (erectile dysfunction)   . Hematuria   . Herpes labialis    Past Surgical History:  Procedure Laterality Date  . KNEE ARTHROSCOPY Left   . KNEE ARTHROSCOPY Right     FAMILY HISTORY History reviewed. No pertinent family  history.  SOCIAL HISTORY Social History   Tobacco Use  . Smoking status: Never Smoker  . Smokeless tobacco: Never Used  Substance Use Topics  . Alcohol use: No  . Drug use: No         OPHTHALMIC EXAM:  Base Eye Exam    Visual Acuity (Snellen - Linear)      Right Left   Dist [place] 20/50 +1 20/40 -2   Dist ph [place] 20/40 -2 20/30 -1       Tonometry (Tonopen, 9:26 AM)      Right Left   Pressure 17 12       Pupils      Dark Light Shape React APD   Right 2 1 Round Minimal 0   Left 2 1 Round Minimal 0       Visual Fields      Left Right    Full Full       Extraocular Movement      Right Left    Full Full       Neuro/Psych    Oriented x3: Yes   Mood/Affect: Normal       Dilation    Both eyes: 1.0% Mydriacyl, 2.5% Phenylephrine @ 9:26 AM        Slit Lamp and Fundus Exam    Slit Lamp Exam      Right Left   Lids/Lashes Mild Meibomian gland dysfunction Mild Meibomian gland dysfunction   Conjunctiva/Sclera Mild Melanosis Mild Melanosis   Cornea Debris in tear film, EBMD Trace Punctate epithelial erosions, Debris in tear film   Anterior Chamber Deep and clear, narrow temporal angle Deep and clear, narrow temporal angle   Iris Round and dilated, No NVI Round and dilated, No NVI   Lens 2+ Nuclear sclerosis, 2-3+ Cortical cataract 2-3+ Nuclear sclerosis, 3+ Cortical cataract   Vitreous Vitreous syneresis Vitreous syneresis       Fundus Exam      Right Left   Disc Mild Pallor, Sharp rim Mild Pallor, Sharp rim   C/D Ratio 0.5 0.5   Macula Blunted foveal reflex, persistent central cystic changes, scattered Microaneurysms / IRH greatest temporally, focal pigment clump ST macula Flat, Good foveal reflex, trace persistent cystic changes inferiorly, scattered Microaneurysms and IRH - improved   Vessels Vascular attenuation, mild Tortuousity Vascular attenuation, mild Tortuousity   Periphery Attached, scattered MA  Attached, large pigmented CR scar IT periphery, scattered  MA / IRH, focal blot heme at 1000 off disc          IMAGING AND PROCEDURES  Imaging and Procedures for @TODAY @  OCT, Retina - OU - Both Eyes       Right Eye Quality was good. Central Foveal Thickness: 439. Progression has worsened. Findings include abnormal foveal contour, intraretinal fluid, no SRF, intraretinal hyper-reflective material (Mild interval  increase in IRF/cystic changes).   Left Eye Quality was good. Central Foveal Thickness: 278. Progression has been stable. Findings include normal foveal contour, intraretinal fluid, no SRF (Persistent IRF IT macula).   Notes *Images captured and stored on drive  Diagnosis / Impression:  +DME OU (OD>OS) OD: Mild interval increase in IRF/cystic changes OS: Persistent IRF IT macula  Clinical management:  See below  Abbreviations: NFP - Normal foveal profile. CME - cystoid macular edema. PED - pigment epithelial detachment. IRF - intraretinal fluid. SRF - subretinal fluid. EZ - ellipsoid zone. ERM - epiretinal membrane. ORA - outer retinal atrophy. ORT - outer retinal tubulation. SRHM - subretinal hyper-reflective material        Intravitreal Injection, Pharmacologic Agent - OD - Right Eye       Time Out 12/17/2019. 9:59 AM. Confirmed correct patient, procedure, site, and patient consented.   Anesthesia Topical anesthesia was used. Anesthetic medications included Lidocaine 2%, Proparacaine 0.5%.   Procedure Preparation included 5% betadine to ocular surface, eyelid speculum. A (32g) needle was used.   Injection:  2 mg aflibercept Gretta Cool) SOLN   NDC: W6696518, Lot: [id number], Expiration date: 03/04/2020   Route: Intravitreal, Site: Right Eye, Waste: 0.05 mL  Post-op Post injection exam found visual acuity of at least counting fingers. The patient tolerated the procedure well. There were no complications. The patient received written and verbal post procedure care education.        Intravitreal Injection,  Pharmacologic Agent - OS - Left Eye       Time Out 12/17/2019. 9:59 AM. Confirmed correct patient, procedure, site, and patient consented.   Anesthesia Topical anesthesia was used. Anesthetic medications included Lidocaine 2%, Proparacaine 0.5%.   Procedure Preparation included 5% betadine to ocular surface, eyelid speculum. A (32g) needle was used.   Injection:  2 mg aflibercept Gretta Cool) SOLN   NDC: L6038910, Lot: [id number], Expiration date: 03/04/2020   Route: Intravitreal, Site: Left Eye, Waste: 0.05 mL  Post-op Post injection exam found visual acuity of at least counting fingers. The patient tolerated the procedure well. There were no complications. The patient received written and verbal post procedure care education.                 ASSESSMENT/PLAN:    ICD-10-CM   1. Moderate nonproliferative diabetic retinopathy of both eyes with macular edema associated with type 2 diabetes mellitus (HCC)  T24.5809 Intravitreal Injection, Pharmacologic Agent - OD - Right Eye    Intravitreal Injection, Pharmacologic Agent - OS - Left Eye    aflibercept (EYLEA) SOLN 2 mg    aflibercept (EYLEA) SOLN 2 mg  2. Retinal edema  H35.81 OCT, Retina - OU - Both Eyes  3. Combined forms of age-related cataract of both eyes  H25.813   4. Ocular hypertension, bilateral  H40.053     1,2. Moderate Non-proliferative diabetic retinopathy, OU             - S/P IVA OD #1 (11.03.20), #2 (12.01.20), #3 (01.15.21), #4 (02.12.21)  - S/P IVA OS #1 (11.16.20), #2 (01.15.21), #3 (02.12.21), #4 (03.12.21), #5 (04.09.21)  - discussed possible resistance to IVA OD and possible switch in medication  - S/P IVE OD #1 (03.12.21), #2 (04.09.21), #3 (05.13.21), #4 (06.10.21)             - S/P IVE OS #1 (05.13.21), #2 (06.10.21)  - FA (11.16.2020) shows late leaking MA OU, mild peripheral nonperfusion OU, no NV OU  - exam shows scattered MA,  IRH, clustered centrally OU  - OCT shows Interval increase in IRF OD;  OS: Persistent cystic changes inferior macula  - recommend IVE OD #5 and IVE OS #3 today, 07.15.21  - pt wishes to proceed  - RBA of procedure discussed, questions answered  - informed consent obtained  - Avastin informed consent form signed and scanned on 01.15.2021  - Eylea informed consent form signed and scanned on 03.12.2021  - see procedure note  - Eylea4U benefits investigation started 02.12.21 -- approved for 2021 (+CoPay card)  - f/u 4-5 weeks -- DFE/OCT/possible injection OU, consider focal laser OS at next visit  3. Mixed form age related cataract OU  - The symptoms of cataract, surgical options, and treatments and risks were discussed with patient.  - discussed diagnosis and progression  - not yet visually significant  - monitor for now  4. Ocular Hypertension OU  - IOP improved today -- 17, 12  - cont brimonidine BID OU   Ophthalmic Meds Ordered this visit:  Meds ordered this encounter  Medications  . aflibercept (EYLEA) SOLN 2 mg  . aflibercept (EYLEA) SOLN 2 mg   Return for f/u 4-5 weeks, NPDR OU, DFE, OCT.  There are no Patient Instructions on file for this visit.  Explained the diagnoses, plan, and follow up with the patient and they expressed understanding.  Patient expressed understanding of the importance of proper follow up care.   This document serves as a record of services personally performed by Karie Chimera, MD, PhD. It was created on their behalf by Joni Reining, an ophthalmic technician. The creation of this record is the provider's dictation and/or activities during the visit.    Electronically signed by: Joni Reining COA, 12/17/19  10:56 AM   This document serves as a record of services personally performed by Karie Chimera, MD, PhD. It was created on their behalf by Glee Arvin. Manson Passey, OA an ophthalmic technician. The creation of this record is the provider's dictation and/or activities during the visit.    Electronically signed by: Glee Arvin.  Manson Passey, [place] 07.15.2021 10:56 AM   Karie Chimera, M.D., Ph.D. Diseases & Surgery of the Retina and Vitreous [facility]  I have reviewed the above documentation for accuracy and completeness, and I agree with the above. Karie Chimera, M.D., Ph.D. 12/17/19 10:56 AM   Abbreviations: M myopia (nearsighted); A astigmatism; H hyperopia (farsighted); P presbyopia; Mrx spectacle prescription;  CTL contact lenses; OD right eye; OS left eye; OU both eyes  XT exotropia; ET esotropia; PEK punctate epithelial keratitis; PEE punctate epithelial erosions; DES dry eye syndrome; MGD meibomian gland dysfunction; ATs artificial tears; PFAT's preservative free artificial tears; NSC nuclear sclerotic cataract; PSC posterior subcapsular cataract; ERM epi-retinal membrane; PVD posterior vitreous detachment; RD retinal detachment; DM diabetes mellitus; DR diabetic retinopathy; NPDR non-proliferative diabetic retinopathy; PDR proliferative diabetic retinopathy; CSME clinically significant macular edema; DME diabetic macular edema; dbh dot blot hemorrhages; CWS cotton wool spot; POAG primary open angle glaucoma; C/D cup-to-disc ratio; HVF humphrey visual field; GVF goldmann visual field; OCT optical coherence tomography; IOP intraocular pressure; BRVO Branch retinal vein occlusion; CRVO central retinal vein occlusion; CRAO central retinal artery occlusion; BRAO branch retinal artery occlusion; RT retinal tear; SB scleral buckle; PPV pars plana vitrectomy; VH Vitreous hemorrhage; PRP panretinal laser photocoagulation; IVK intravitreal kenalog; VMT vitreomacular traction; MH Macular hole;  NVD neovascularization of the disc; NVE neovascularization elsewhere; AREDS age related eye disease study; ARMD age  related macular degeneration; POAG primary open angle glaucoma; EBMD epithelial/anterior basement membrane dystrophy; ACIOL anterior chamber intraocular lens; IOL intraocular lens; PCIOL posterior chamber  intraocular lens; Phaco/IOL phacoemulsification with intraocular lens placement; PRK photorefractive keratectomy; LASIK laser assisted in situ keratomileusis; HTN hypertension; DM diabetes mellitus; COPD chronic obstructive pulmonary disease

## 2019-12-17 ENCOUNTER — Other Ambulatory Visit: Payer: Self-pay

## 2019-12-17 ENCOUNTER — Encounter (INDEPENDENT_AMBULATORY_CARE_PROVIDER_SITE_OTHER): Payer: Self-pay | Admitting: Ophthalmology

## 2019-12-17 ENCOUNTER — Ambulatory Visit (INDEPENDENT_AMBULATORY_CARE_PROVIDER_SITE_OTHER): Payer: BC Managed Care – PPO | Admitting: Ophthalmology

## 2019-12-17 DIAGNOSIS — H25813 Combined forms of age-related cataract, bilateral: Secondary | ICD-10-CM | POA: Diagnosis not present

## 2019-12-17 DIAGNOSIS — H3581 Retinal edema: Secondary | ICD-10-CM | POA: Diagnosis not present

## 2019-12-17 DIAGNOSIS — H40053 Ocular hypertension, bilateral: Secondary | ICD-10-CM

## 2019-12-17 DIAGNOSIS — E113313 Type 2 diabetes mellitus with moderate nonproliferative diabetic retinopathy with macular edema, bilateral: Secondary | ICD-10-CM

## 2019-12-17 MED ORDER — AFLIBERCEPT 2MG/0.05ML IZ SOLN FOR KALEIDOSCOPE
2.0000 mg | INTRAVITREAL | Status: AC | PRN
  Administered 2019-12-17: 2 mg via INTRAVITREAL

## 2019-12-18 DIAGNOSIS — D539 Nutritional anemia, unspecified: Secondary | ICD-10-CM | POA: Diagnosis not present

## 2019-12-18 DIAGNOSIS — E785 Hyperlipidemia, unspecified: Secondary | ICD-10-CM | POA: Diagnosis not present

## 2019-12-18 DIAGNOSIS — E113313 Type 2 diabetes mellitus with moderate nonproliferative diabetic retinopathy with macular edema, bilateral: Secondary | ICD-10-CM | POA: Diagnosis not present

## 2019-12-18 DIAGNOSIS — Z832 Family history of diseases of the blood and blood-forming organs and certain disorders involving the immune mechanism: Secondary | ICD-10-CM | POA: Diagnosis not present

## 2020-01-15 IMAGING — MR MRI OF THE RIGHT SHOULDER WITHOUT CONTRAST
5 series · 40 of 40 positions shown · non-contrast
Comparison: None.

CLINICAL DATA: Chronic progressive right shoulder pain. Painful
range of motion.

EXAM:
MRI OF THE RIGHT SHOULDER WITHOUT CONTRAST
TECHNIQUE: Multiplanar, multisequence MR imaging of the shoulder was performed.
No intravenous contrast was administered.

[Series 3: PD fat-sat · axial · 4.0mm · 0.59mm/px · z∈[-31,+68]mm · 8 of 24 slices shown (1 of 2)]
[im 1/24]
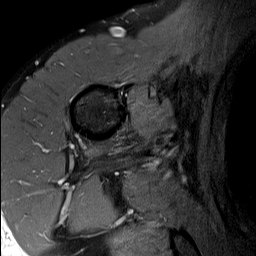
[im 4/24]
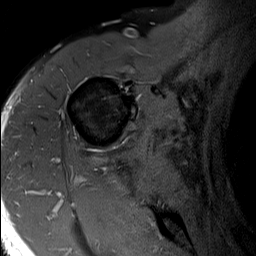
[im 7/24]
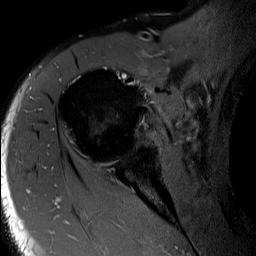
[im 10/24]
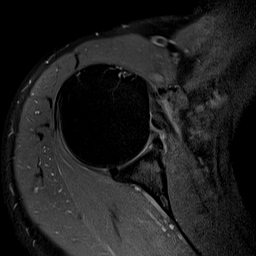
[im 14/24]
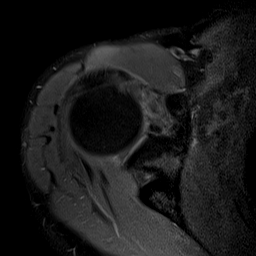
[im 17/24]
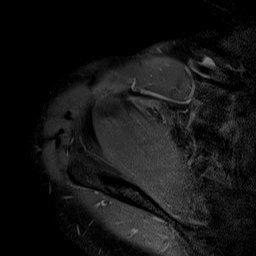
[im 20/24]
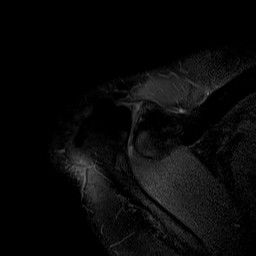
[im 24/24]
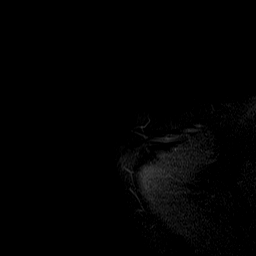

[Series 4: T2 fat-sat · oblique · 4.0mm · 0.59mm/px · 8 of 22 slices shown (1 of 2)]
[im 1/22]
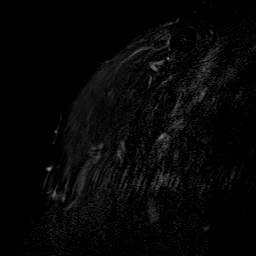
[im 4/22]
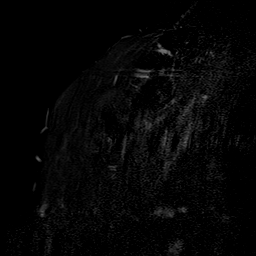
[im 7/22]
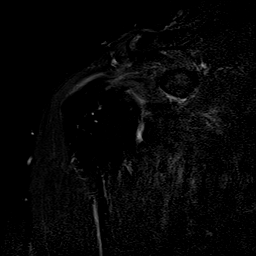
[im 10/22]
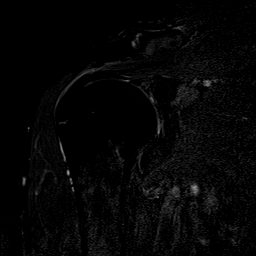
[im 13/22]
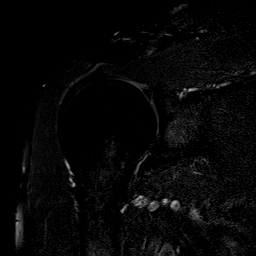
[im 16/22]
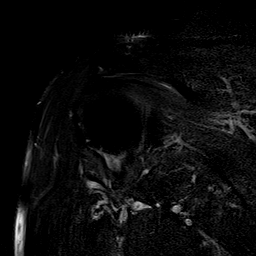
[im 19/22]
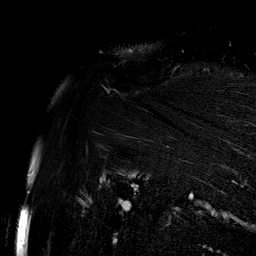
[im 22/22]
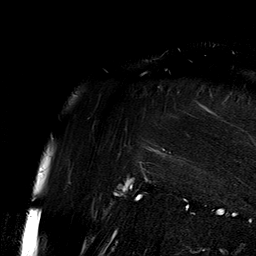

[Series 5: PD fat-sat · oblique · 4.0mm · 0.59mm/px · 8 of 22 slices shown (2 of 2)]
[im 1/22]
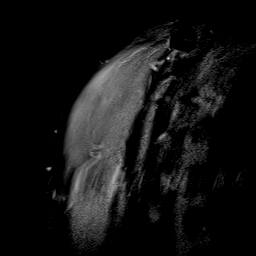
[im 4/22]
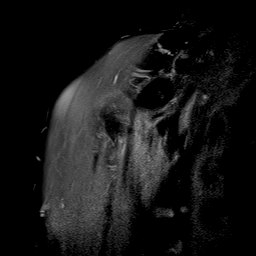
[im 7/22]
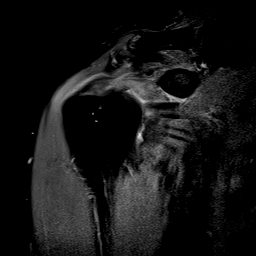
[im 10/22]
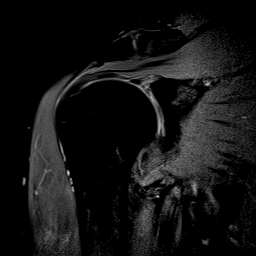
[im 13/22]
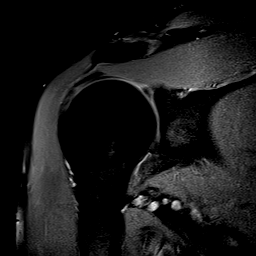
[im 16/22]
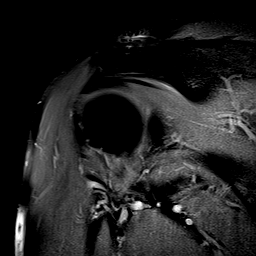
[im 19/22]
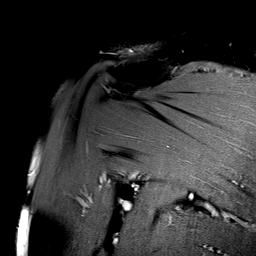
[im 22/22]
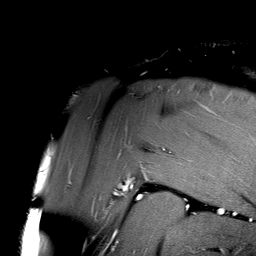

[Series 6: T1 · oblique · 4.0mm · 0.59mm/px · 8 of 24 slices shown]
[im 1/24]
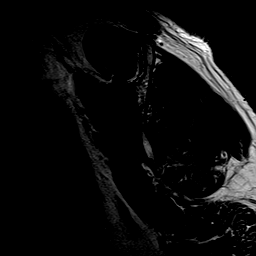
[im 4/24]
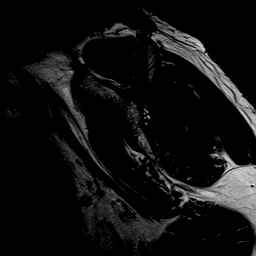
[im 7/24]
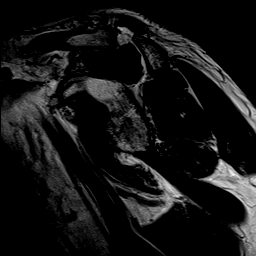
[im 10/24]
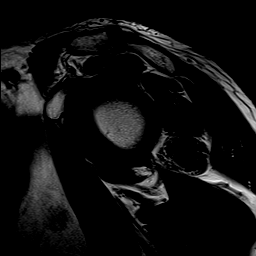
[im 14/24]
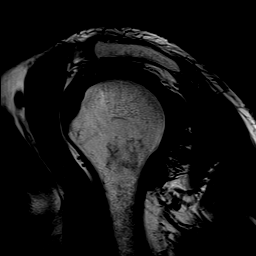
[im 17/24]
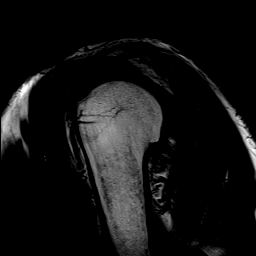
[im 20/24]
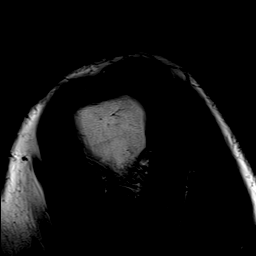
[im 24/24]
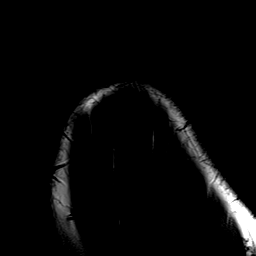

[Series 7: T2 fat-sat · oblique · 4.0mm · 0.59mm/px · 8 of 24 slices shown (2 of 2)]
[im 1/24]
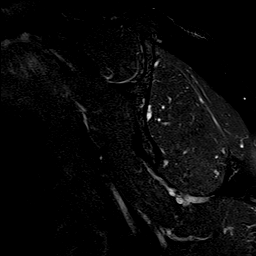
[im 4/24]
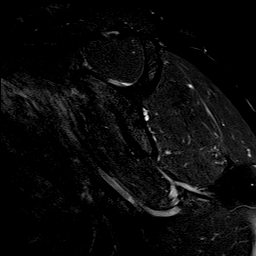
[im 7/24]
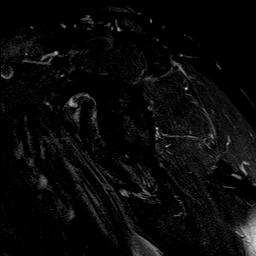
[im 10/24]
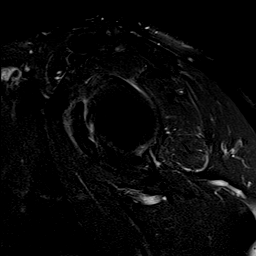
[im 14/24]
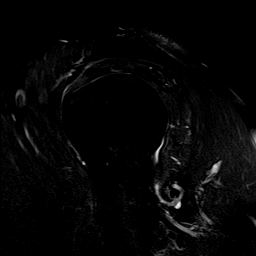
[im 17/24]
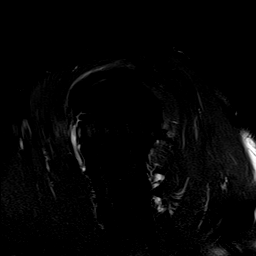
[im 20/24]
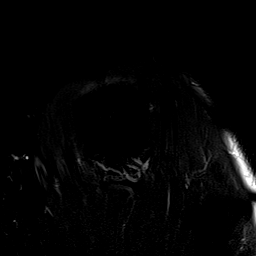
[im 24/24]
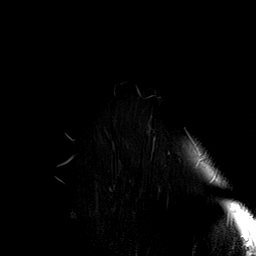

[40 of 40 positions shown; findings below may reference images not displayed]

FINDINGS: Rotator cuff: Intact. Slight degenerative changes of the
subscapularis tendon with slight edema around the tendon.

Muscles: No atrophy or abnormal signal of the muscles of the rotator
cuff.

Biceps long head:  Properly located and intact.

Acromioclavicular Joint: Minimal cystic degenerative changes of the
distal clavicle. Otherwise normal AC joint. Type 2 acromion. No
bursitis.

Glenohumeral Joint: No joint effusion. No chondral defect.

Labrum:  Intact.  Prominent sublabral foramen.

Bones:  No significant abnormality.

Other: None
IMPRESSION: Slight degenerative changes and inflammation as indicated by edema,
around the subscapularis tendon.

No other significant abnormalities.

## 2020-01-20 ENCOUNTER — Encounter (INDEPENDENT_AMBULATORY_CARE_PROVIDER_SITE_OTHER): Payer: BC Managed Care – PPO | Admitting: Ophthalmology

## 2020-01-22 ENCOUNTER — Other Ambulatory Visit: Payer: Self-pay | Admitting: Orthopedic Surgery

## 2020-01-22 DIAGNOSIS — M25512 Pain in left shoulder: Secondary | ICD-10-CM

## 2020-01-25 DIAGNOSIS — F411 Generalized anxiety disorder: Secondary | ICD-10-CM | POA: Diagnosis not present

## 2020-01-28 NOTE — Progress Notes (Signed)
[facility] - Clinic Note  02/01/2020  CHIEF COMPLAINT Patient presents for Retina Follow Up  HISTORY OF PRESENT ILLNESS: Derrick Terry is a 59 y.o. male who presents to the clinic today for:   HPI    Retina Follow Up    Patient presents with  Diabetic Retinopathy.  In both eyes.  This started 6.5 weeks ago.  Severity is moderate.  I, the attending physician,  performed the HPI with the patient and updated documentation appropriately.          Comments    Patient here for 6.5 weeks for retina follow up for NPDR OU. Patient states vision doing ok. No eye pain.       Last edited by Derrick Chris, MD on 02/01/2020  9:38 AM. (History)    pt feels like his vision may be better, he states his endocrinologist took him off of pioglitazone bc it causes inflammation, pt is on victoza and synjardy for diabetes   Referring physician: [name], MD [phone] [address] [place],  [place] [address]  HISTORICAL INFORMATION:   Selected notes from the MEDICAL RECORD NUMBER DEE - Referred by PCP, Dr. Johny Terry   CURRENT MEDICATIONS: Current Outpatient Medications (Ophthalmic Drugs)  Medication Sig  . brimonidine (ALPHAGAN) 0.2 % ophthalmic solution Place 1 drop into both eyes 2 (two) times daily.   No current facility-administered medications for this visit. (Ophthalmic Drugs)   Current Outpatient Medications (Other)  Medication Sig  . atorvastatin (LIPITOR) 10 MG tablet Take 10 mg by mouth daily.  . B-D UF III MINI PEN NEEDLES 31G X 5 MM MISC USE TO ADMINISTER VICTOZA ONCE A DAY  . celecoxib (CELEBREX) 200 MG capsule Take 200 mg by mouth daily.  . CONTOUR NEXT TEST test strip USE STRIP TO CHECK GLUCOSE ONCE DAILY  . cyclobenzaprine (FLEXERIL) 10 MG tablet Take 1 tablet (10 mg total) by mouth 2 (two) times daily as needed.  . Empagliflozin-metFORMIN HCl (SYNJARDY PO) Take by mouth.  . levocetirizine (XYZAL) 5 MG tablet Take 5 mg by mouth every  evening.  . lidocaine (XYLOCAINE) 5 % ointment Apply topically as needed.  . Liraglutide (VICTOZA [place]) Inject into the skin daily.  . metFORMIN (GLUCOPHAGE) 1000 MG tablet Take 1,000 mg by mouth 2 (two) times daily with a meal.  . Microlet Lancets MISC USE TO CHECK GLUCOSE ONCE DAILY  . phenazopyridine (PYRIDIUM) 100 MG tablet Take 100 mg by mouth 3 (three) times daily as needed.  . pioglitazone (ACTOS) 45 MG tablet Take 45 mg by mouth daily.  . predniSONE (STERAPRED UNI-PAK 48 TAB) 5 MG (48) TBPK tablet See admin instructions.  . quinapril (ACCUPRIL) 10 MG tablet Take 10 mg by mouth daily.  . tadalafil (CIALIS) 20 MG tablet Take 20 mg by mouth daily as needed.  . valACYclovir (VALTREX) 1000 MG tablet SMARTSIG:2 Tablet(s) By Mouth Every 12 Hours PRN  . valACYclovir (VALTREX) 500 MG tablet Take 500 mg by mouth as needed.   Marland Kitchen VICTOZA 18 MG/3ML SOPN Inject 1.8 mg into the skin daily.   No current facility-administered medications for this visit. (Other)      REVIEW OF SYSTEMS: ROS    Positive for: Endocrine, Eyes   Negative for: Constitutional, Gastrointestinal, Neurological, Skin, Genitourinary, Musculoskeletal, HENT, Cardiovascular, Respiratory, Psychiatric, Allergic/Imm, Heme/Lymph   Last edited by Derrick Terry, COA on 02/01/2020  9:16 AM. (History)       ALLERGIES Allergies  Allergen Reactions  .  Toradol [Ketorolac Tromethamine]     itching    PAST MEDICAL HISTORY Past Medical History:  Diagnosis Date  . Diabetes mellitus without complication (HCC)   . Diabetic retinopathy (HCC)   . ED (erectile dysfunction)   . Hematuria   . Herpes labialis    Past Surgical History:  Procedure Laterality Date  . KNEE ARTHROSCOPY Left   . KNEE ARTHROSCOPY Right     FAMILY HISTORY History reviewed. No pertinent family history.  SOCIAL HISTORY Social History   Tobacco Use  . Smoking status: Never Smoker  . Smokeless tobacco: Never Used  Substance Use Topics  . Alcohol  use: No  . Drug use: No         OPHTHALMIC EXAM:  Base Eye Exam    Visual Acuity (Snellen - Linear)      Right Left   Dist [place] 20/40 -1 20/40 +2   Dist ph [place] NI 20/30 -2       Tonometry (Tonopen, 9:12 AM)      Right Left   Pressure 21 18       Pupils      Dark Light Shape React APD   Right 2 1 Round Minimal None   Left 2 1 Round Minimal None       Visual Fields (Counting fingers)      Left Right    Full Full       Extraocular Movement      Right Left    Full, Ortho Full, Ortho       Neuro/Psych    Oriented x3: Yes   Mood/Affect: Normal       Dilation    Both eyes: 1.0% Mydriacyl, 2.5% Phenylephrine @ 9:12 AM        Slit Lamp and Fundus Exam    Slit Lamp Exam      Right Left   Lids/Lashes Mild Meibomian gland dysfunction Mild Meibomian gland dysfunction   Conjunctiva/Sclera Mild Melanosis Mild Melanosis   Cornea Debris in tear film, EBMD Trace Punctate epithelial erosions, Debris in tear film   Anterior Chamber Deep and clear, narrow temporal angle Deep and clear, narrow temporal angle   Iris Round and dilated, No NVI Round and dilated, No NVI   Lens 2+ Nuclear sclerosis, 2-3+ Cortical cataract 2-3+ Nuclear sclerosis, 3+ Cortical cataract   Vitreous Vitreous syneresis Vitreous syneresis       Fundus Exam      Right Left   Disc Mild Pallor, Sharp rim Mild Pallor, Sharp rim   C/D Ratio 0.5 0.5   Macula Blunted foveal reflex, persistent central cystic changes -- improved, scattered Microaneurysms / IRH greatest temporally, focal pigment clump ST macula Flat, Good foveal reflex, trace persistent cystic changes inferiorly, scattered Microaneurysms and IRH - improved, +focal targets inferiorly   Vessels Vascular attenuation, mild Tortuousity Vascular attenuation, mild Tortuousity   Periphery Attached, scattered MA  Attached, large pigmented CR scar IT periphery, scattered MA / IRH, focal blot heme at 1000 off disc          IMAGING AND PROCEDURES   Imaging and Procedures for @  OCT, Retina - OU - Both Eyes       Right Eye Quality was good. Central Foveal Thickness: 395. Progression has improved. Findings include abnormal foveal contour, intraretinal fluid, no SRF, intraretinal hyper-reflective material (interval improvement in IRF/IRHM).   Left Eye Quality was good. Central Foveal Thickness: 278. Progression has worsened. Findings include normal foveal contour, intraretinal fluid, no SRF (  Interval increase in focal IRF IT macula).   Notes *Images captured and stored on drive  Diagnosis / Impression:  +DME OU (OD>OS) OD: Interval decrease in IRF/cystic changes OS: Mild interval increase in IRF IT macula  Clinical management:  See below  Abbreviations: NFP - Normal foveal profile. CME - cystoid macular edema. PED - pigment epithelial detachment. IRF - intraretinal fluid. SRF - subretinal fluid. EZ - ellipsoid zone. ERM - epiretinal membrane. ORA - outer retinal atrophy. ORT - outer retinal tubulation. SRHM - subretinal hyper-reflective material        Intravitreal Injection, Pharmacologic Agent - OD - Right Eye       Time Out 02/01/2020. 9:44 AM. Confirmed correct patient, procedure, site, and patient consented.   Anesthesia Topical anesthesia was used. Anesthetic medications included Lidocaine 2%, Proparacaine 0.5%.   Procedure Preparation included 5% betadine to ocular surface, eyelid speculum. A (32g) needle was used.   Injection:  2 mg aflibercept Gretta Cool) SOLN   NDC: L6038910, Lot: [id number], Expiration date: 11/02/2020   Route: Intravitreal, Site: Right Eye, Waste: 0.05 mL  Post-op Post injection exam found visual acuity of at least counting fingers. The patient tolerated the procedure well. There were no complications. The patient received written and verbal post procedure care education.        Intravitreal Injection, Pharmacologic Agent - OS - Left Eye       Time Out 02/01/2020. 9:44 AM.  Confirmed correct patient, procedure, site, and patient consented.   Anesthesia Topical anesthesia was used. Anesthetic medications included Lidocaine 2%, Proparacaine 0.5%.   Procedure Preparation included 5% betadine to ocular surface, eyelid speculum. A (32g) needle was used.   Injection:  2 mg aflibercept Gretta Cool) SOLN   NDC: L6038910, Lot: [id number], Expiration date: 04/04/2020   Route: Intravitreal, Site: Left Eye, Waste: 0.05 mL  Post-op Post injection exam found visual acuity of at least counting fingers. The patient tolerated the procedure well. There were no complications. The patient received written and verbal post procedure care education.                 ASSESSMENT/PLAN:    ICD-10-CM   1. Moderate nonproliferative diabetic retinopathy of both eyes with macular edema associated with type 2 diabetes mellitus (HCC)  M57.8469 Intravitreal Injection, Pharmacologic Agent - OD - Right Eye    Intravitreal Injection, Pharmacologic Agent - OS - Left Eye    aflibercept (EYLEA) SOLN 2 mg    aflibercept (EYLEA) SOLN 2 mg  2. Retinal edema  H35.81 OCT, Retina - OU - Both Eyes  3. Combined forms of age-related cataract of both eyes  H25.813   4. Ocular hypertension, bilateral  H40.053   5. Hypertensive retinopathy of both eyes  H35.033     1,2. Moderate Non-proliferative diabetic retinopathy, OU             - S/P IVA OD #1 (11.03.20), #2 (12.01.20), #3 (01.15.21), #4 (02.12.21)  - S/P IVA OS #1 (11.16.20), #2 (01.15.21), #3 (02.12.21), #4 (03.12.21), #5 (04.09.21)  - discussed possible resistance to IVA OD and possible switch in medication  - S/P IVE OD #1 (03.12.21), #2 (04.09.21), #3 (05.13.21), #4 (06.10.21), #5 (07.15.21)             - S/P IVE OS #1 (05.13.21), #2 (06.10.21), #3 (07.15.21)  - FA (11.16.2020) shows late leaking MA OU, mild peripheral nonperfusion OU, no NV OU  - exam shows scattered MA, IRH, clustered centrally OU  - OCT shows Interval  improvement in  IRF OD; OS: Persistent cystic changes inferior macula  - recommend IVE OD #6 and IVE OS #4 today, 08.30.21 and discussed possible focal laser OS  - pt wishes to proceed  - RBA of procedure discussed, questions answered  - informed consent obtained  - Avastin informed consent form signed and scanned on 01.15.2021  - Eylea informed consent form signed and scanned on 03.12.2021  - see procedure note  - Eylea4U benefits investigation started 02.12.21 -- approved for 2021 (+CoPay card)  - f/u 1-2 weeks -- DFE/OCT/focal laser OS at next visit  3. Mixed form age related cataract OU  - The symptoms of cataract, surgical options, and treatments and risks were discussed with patient.  - discussed diagnosis and progression  - not yet visually significant  - monitor for now  4. Ocular Hypertension OU  - IOP today -- 21,18  - cont brimonidine BID OU   Ophthalmic Meds Ordered this visit:  Meds ordered this encounter  Medications  . aflibercept (EYLEA) SOLN 2 mg  . aflibercept (EYLEA) SOLN 2 mg   Return in about 2 weeks (around 02/15/2020) for f/u NPDR OU, focal laser OS.  There are no Patient Instructions on file for this visit.  This document serves as a record of services personally performed by Karie Chimera, MD, PhD. It was created on their behalf by Herby Abraham, COA, an ophthalmic technician. The creation of this record is the provider's dictation and/or activities during the visit.    Electronically signed by: Herby Abraham, COA 08.26.2021 1:07 PM   This document serves as a record of services personally performed by Karie Chimera, MD, PhD. It was created on their behalf by Glee Arvin. Manson Passey, OA an ophthalmic technician. The creation of this record is the provider's dictation and/or activities during the visit.    Electronically signed by: Glee Arvin. Manson Passey, [place] 08.30.2021 1:07 PM  Karie Chimera, M.D., Ph.D. Diseases & Surgery of the Retina and Vitreous [facility] 02/01/2020    I have reviewed the above documentation for accuracy and completeness, and I agree with the above. Karie Chimera, M.D., Ph.D. 02/01/20 1:07 PM    Abbreviations: M myopia (nearsighted); A astigmatism; H hyperopia (farsighted); P presbyopia; Mrx spectacle prescription;  CTL contact lenses; OD right eye; OS left eye; OU both eyes  XT exotropia; ET esotropia; PEK punctate epithelial keratitis; PEE punctate epithelial erosions; DES dry eye syndrome; MGD meibomian gland dysfunction; ATs artificial tears; PFAT's preservative free artificial tears; NSC nuclear sclerotic cataract; PSC posterior subcapsular cataract; ERM epi-retinal membrane; PVD posterior vitreous detachment; RD retinal detachment; DM diabetes mellitus; DR diabetic retinopathy; NPDR non-proliferative diabetic retinopathy; PDR proliferative diabetic retinopathy; CSME clinically significant macular edema; DME diabetic macular edema; dbh dot blot hemorrhages; CWS cotton wool spot; POAG primary open angle glaucoma; C/D cup-to-disc ratio; HVF humphrey visual field; GVF goldmann visual field; OCT optical coherence tomography; IOP intraocular pressure; BRVO Branch retinal vein occlusion; CRVO central retinal vein occlusion; CRAO central retinal artery occlusion; BRAO branch retinal artery occlusion; RT retinal tear; SB scleral buckle; PPV pars plana vitrectomy; VH Vitreous hemorrhage; PRP panretinal laser photocoagulation; IVK intravitreal kenalog; VMT vitreomacular traction; MH Macular hole;  NVD neovascularization of the disc; NVE neovascularization elsewhere; AREDS age related eye disease study; ARMD age related macular degeneration; POAG primary open angle glaucoma; EBMD epithelial/anterior basement membrane dystrophy; ACIOL anterior chamber intraocular lens; IOL intraocular lens; PCIOL posterior chamber intraocular lens; Phaco/IOL phacoemulsification with  intraocular lens placement; PRK photorefractive keratectomy; LASIK  laser assisted in situ keratomileusis; HTN hypertension; DM diabetes mellitus; COPD chronic obstructive pulmonary disease

## 2020-02-01 ENCOUNTER — Ambulatory Visit (INDEPENDENT_AMBULATORY_CARE_PROVIDER_SITE_OTHER): Payer: BC Managed Care – PPO | Admitting: Ophthalmology

## 2020-02-01 ENCOUNTER — Other Ambulatory Visit: Payer: Self-pay

## 2020-02-01 ENCOUNTER — Encounter (INDEPENDENT_AMBULATORY_CARE_PROVIDER_SITE_OTHER): Payer: Self-pay | Admitting: Ophthalmology

## 2020-02-01 DIAGNOSIS — H25813 Combined forms of age-related cataract, bilateral: Secondary | ICD-10-CM

## 2020-02-01 DIAGNOSIS — E113313 Type 2 diabetes mellitus with moderate nonproliferative diabetic retinopathy with macular edema, bilateral: Secondary | ICD-10-CM | POA: Diagnosis not present

## 2020-02-01 DIAGNOSIS — H3581 Retinal edema: Secondary | ICD-10-CM

## 2020-02-01 DIAGNOSIS — H40053 Ocular hypertension, bilateral: Secondary | ICD-10-CM | POA: Diagnosis not present

## 2020-02-01 DIAGNOSIS — H35033 Hypertensive retinopathy, bilateral: Secondary | ICD-10-CM

## 2020-02-01 MED ORDER — AFLIBERCEPT 2MG/0.05ML IZ SOLN FOR KALEIDOSCOPE
2.0000 mg | INTRAVITREAL | Status: AC | PRN
  Administered 2020-02-01: 2 mg via INTRAVITREAL

## 2020-02-04 DIAGNOSIS — F411 Generalized anxiety disorder: Secondary | ICD-10-CM | POA: Diagnosis not present

## 2020-02-11 NOTE — Progress Notes (Signed)
[facility] - Clinic Note  02/15/2020  CHIEF COMPLAINT Patient presents for Retina Follow Up  HISTORY OF PRESENT ILLNESS: Derrick Terry is a 59 y.o. male who presents to the clinic today for:   HPI    Retina Follow Up    Patient presents with  Diabetic Retinopathy.  In both eyes.  This started 2 weeks ago.  Severity is moderate.  I, the attending physician,  performed the HPI with the patient and updated documentation appropriately.          Comments    Patient here for 2 weeks retina follow up for NPDR OU. Patient states vision doing fine. No eye pain.        Last edited by [name], MD on 02/15/2020 10:36 AM. (History)    pt here for focal laser OS today   Referring physician: [name], MD [phone] [place],  [place] [address]  HISTORICAL INFORMATION:   Selected notes from the MEDICAL RECORD NUMBER DEE - Referred by PCP, Dr. [name]   CURRENT MEDICATIONS: Current Outpatient Medications (Ophthalmic Drugs)  Medication Sig  . brimonidine (ALPHAGAN) 0.2 % ophthalmic solution Place 1 drop into both eyes 2 (two) times daily.  . prednisoLONE acetate (PRED FORTE) 1 % ophthalmic suspension Place 1 drop into the left eye 4 (four) times daily for 7 days.   No current facility-administered medications for this visit. (Ophthalmic Drugs)   Current Outpatient Medications (Other)  Medication Sig  . atorvastatin (LIPITOR) 10 MG tablet Take 10 mg by mouth daily.  . B-D UF III MINI PEN NEEDLES 31G X 5 MM MISC USE TO ADMINISTER VICTOZA ONCE A DAY  . celecoxib (CELEBREX) 200 MG capsule Take 200 mg by mouth daily.  . CONTOUR NEXT TEST test strip USE STRIP TO CHECK GLUCOSE ONCE DAILY  . cyclobenzaprine (FLEXERIL) 10 MG tablet Take 1 tablet (10 mg total) by mouth 2 (two) times daily as needed.  . Empagliflozin-metFORMIN HCl (SYNJARDY PO) Take by mouth.  . levocetirizine (XYZAL) 5 MG tablet Take 5 mg by mouth every evening.  .  lidocaine (XYLOCAINE) 5 % ointment Apply topically as needed.  . Liraglutide (VICTOZA [place]) Inject into the skin daily.  . metFORMIN (GLUCOPHAGE) 1000 MG tablet Take 1,000 mg by mouth 2 (two) times daily with a meal.  . Microlet Lancets MISC USE TO CHECK GLUCOSE ONCE DAILY  . phenazopyridine (PYRIDIUM) 100 MG tablet Take 100 mg by mouth 3 (three) times daily as needed.  . pioglitazone (ACTOS) 45 MG tablet Take 45 mg by mouth daily.  . predniSONE (STERAPRED UNI-PAK 48 TAB) 5 MG (48) TBPK tablet See admin instructions.  . quinapril (ACCUPRIL) 10 MG tablet Take 10 mg by mouth daily.  . tadalafil (CIALIS) 20 MG tablet Take 20 mg by mouth daily as needed.  . valACYclovir (VALTREX) 1000 MG tablet SMARTSIG:2 Tablet(s) By Mouth Every 12 Hours PRN  . valACYclovir (VALTREX) 500 MG tablet Take 500 mg by mouth as needed.   Marland Kitchen VICTOZA 18 MG/3ML SOPN Inject 1.8 mg into the skin daily.   No current facility-administered medications for this visit. (Other)      REVIEW OF SYSTEMS: ROS    Positive for: Endocrine, Eyes   Negative for: Constitutional, Gastrointestinal, Neurological, Skin, Genitourinary, Musculoskeletal, HENT, Cardiovascular, Respiratory, Psychiatric, Allergic/Imm, Heme/Lymph   Last edited by Laddie Aquas, COA on 02/15/2020  9:25 AM. (History)       ALLERGIES Allergies  Allergen Reactions  .  Toradol [Ketorolac Tromethamine]     itching    PAST MEDICAL HISTORY Past Medical History:  Diagnosis Date  . Diabetes mellitus without complication (HCC)   . Diabetic retinopathy (HCC)   . ED (erectile dysfunction)   . Hematuria   . Herpes labialis    Past Surgical History:  Procedure Laterality Date  . KNEE ARTHROSCOPY Left   . KNEE ARTHROSCOPY Right     FAMILY HISTORY History reviewed. No pertinent family history.  SOCIAL HISTORY Social History   Tobacco Use  . Smoking status: Never Smoker  . Smokeless tobacco: Never Used  Substance Use Topics  . Alcohol use: No  . Drug  use: No         OPHTHALMIC EXAM:  Base Eye Exam    Visual Acuity (Snellen - Linear)      Right Left   Dist cc 20/40 +1 20/30 -2   Dist ph cc NI 20/25 -1       Tonometry (Tonopen, 9:22 AM)      Right Left   Pressure 11 10       Pupils      Dark Light Shape React APD   Right 2 1 Round Minimal None   Left 2 1 Round Minimal None       Visual Fields (Counting fingers)      Left Right    Full Full       Extraocular Movement      Right Left    Full, Ortho Full, Ortho       Neuro/Psych    Oriented x3: Yes   Mood/Affect: Normal       Dilation    Both eyes: 1.0% Mydriacyl, 2.5% Phenylephrine @ 9:21 AM        Slit Lamp and Fundus Exam    Slit Lamp Exam      Right Left   Lids/Lashes Mild Meibomian gland dysfunction Mild Meibomian gland dysfunction   Conjunctiva/Sclera Mild Melanosis Mild Melanosis   Cornea Debris in tear film, EBMD Trace Punctate epithelial erosions, Debris in tear film   Anterior Chamber Deep and clear, narrow temporal angle Deep and clear, narrow temporal angle   Iris Round and dilated, No NVI Round and dilated, No NVI   Lens 2+ Nuclear sclerosis, 2-3+ Cortical cataract 2-3+ Nuclear sclerosis, 3+ Cortical cataract   Vitreous Vitreous syneresis Vitreous syneresis       Fundus Exam      Right Left   Disc Mild Pallor, Sharp rim Mild Pallor, Sharp rim   C/D Ratio 0.5 0.5   Macula Blunted foveal reflex, persistent central cystic changes -- improved, scattered Microaneurysms / IRH greatest temporally, focal pigment clump ST macula Flat, Good foveal reflex, trace persistent cystic changes inferiorly, scattered Microaneurysms and IRH - improved, +focal targets inferiorly   Vessels Vascular attenuation, mild Tortuousity Vascular attenuation, mild Tortuousity   Periphery Attached, scattered MA  Attached, large pigmented CR scar IT periphery, scattered MA / IRH, focal blot heme at 1000 off disc          IMAGING AND PROCEDURES  Imaging and Procedures  for @TODAY @  OCT, Retina - OU - Both Eyes       Right Eye Quality was good. Central Foveal Thickness: 397. Progression has been stable. Findings include abnormal foveal contour, intraretinal fluid, no SRF, intraretinal hyper-reflective material (persistent IRF/IRHM).   Left Eye Quality was good. Central Foveal Thickness: 280. Progression has been stable. Findings include normal foveal contour, intraretinal fluid, no SRF (  persistent focal IRF IT macula).   Notes *Images captured and stored on drive  Diagnosis / Impression:  +DME OU (OD>OS) OD: persistent IRF/cystic changes OS: persistent IRF IT macula  Clinical management:  See below  Abbreviations: NFP - Normal foveal profile. CME - cystoid macular edema. PED - pigment epithelial detachment. IRF - intraretinal fluid. SRF - subretinal fluid. EZ - ellipsoid zone. ERM - epiretinal membrane. ORA - outer retinal atrophy. ORT - outer retinal tubulation. SRHM - subretinal hyper-reflective material        Focal Laser - OS - Left Eye       LASER PROCEDURE NOTE  Diagnosis:   Diabetic macular edema, left eye  Procedure:  Focal laser photocoagulation using slit lamp laser, left eye  Anesthesia:  Topical  Surgeon: [name], MD, PhD   Informed consent obtained, operative eye marked, and time out performed prior to initiation of laser.   Lumenis [id number] Focal/Grid laser Lens: OMRA-S Power: 110-120 mW Duration: 50 msec  Spot size: 100 microns  # spots: 84 spots placed to perifoveal MAs.  Complications: None.  RTC: 2 wks  Patient tolerated the procedure well and received written and verbal post-procedure care information/education.                   ASSESSMENT/PLAN:    ICD-10-CM   1. Moderate nonproliferative diabetic retinopathy of both eyes with macular edema associated with type 2 diabetes mellitus (HCC)  B44.9675 Focal Laser - OS - Left Eye  2. Retinal edema  H35.81 OCT, Retina - OU - Both Eyes  3.  Combined forms of age-related cataract of both eyes  H25.813   4. Ocular hypertension, bilateral  H40.053   5. Hypertensive retinopathy of both eyes  H35.033     1,2. Moderate Non-proliferative diabetic retinopathy, OU             - S/P IVA OD #1 (11.03.20), #2 (12.01.20), #3 (01.15.21), #4 (02.12.21)  - S/P IVA OS #1 (11.16.20), #2 (01.15.21), #3 (02.12.21), #4 (03.12.21), #5 (04.09.21)  - discussed possible resistance to IVA OD and possible switch in medication  - S/P IVE OD #1 (03.12.21), #2 (04.09.21), #3 (05.13.21), #4 (06.10.21), #5 (07.15.21), #6 (08.30.21)             - S/P IVE OS #1 (05.13.21), #2 (06.10.21), #3 (07.15.21), #4 (08.30.21)  - FA (11.16.2020) shows late leaking MA OU, mild peripheral nonperfusion OU, no NV OU  - exam shows scattered MA, IRH, clustered centrally OU  - OCT shows persistent IRF OD; OS: Persistent cystic changes inferior macula  - recommend focal laser OS today, 09.13.21  - pt wishes to proceed with laser  - RBA of procedure discussed, questions answered  - informed consent obtained  - Avastin informed consent form signed and scanned on 01.15.2021  - Eylea informed consent form signed and scanned on 03.12.2021  - see procedure note  - [email or web address] benefits investigation started 02.12.21 -- approved for 2021 (+CoPay card)  - f/u week of September 27 -- DFE/OCT, possible injection(s)  3. Mixed form age related cataract OU  - The symptoms of cataract, surgical options, and treatments and risks were discussed with patient.  - discussed diagnosis and progression  - not yet visually significant  - monitor for now  4. Ocular Hypertension OU  - IOP today -- 11,10  - cont brimonidine BID OU   Ophthalmic Meds Ordered this visit:  Meds ordered this encounter  Medications  . prednisoLONE acetate (PRED  FORTE) 1 % ophthalmic suspension    Sig: Place 1 drop into the left eye 4 (four) times daily for 7 days.    Dispense:  10 mL    Refill:  0   Return for f/u  week of September 27, NPDR OU, DFE, OCT.  There are no Patient Instructions on file for this visit.  This document serves as a record of services personally performed by Karie ChimeraBrian G. Chemika Nightengale, MD, PhD. It was created on their behalf by Herby AbrahamAshley English, COA, an ophthalmic technician. The creation of this record is the provider's dictation and/or activities during the visit.    Electronically signed by: Herby AbrahamAshley English, COA @TODAY @ 12:10 PM  Karie ChimeraBrian G. Donnavan Covault, M.D., Ph.D. Diseases & Surgery of the Retina and Vitreous [facility] 02/15/2020   I have reviewed the above documentation for accuracy and completeness, and I agree with the above. Karie ChimeraBrian G. Analyce Tavares, M.D., Ph.D. 02/15/20 12:13 PM    Abbreviations: M myopia (nearsighted); A astigmatism; H hyperopia (farsighted); P presbyopia; Mrx spectacle prescription;  CTL contact lenses; OD right eye; OS left eye; OU both eyes  XT exotropia; ET esotropia; PEK punctate epithelial keratitis; PEE punctate epithelial erosions; DES dry eye syndrome; MGD meibomian gland dysfunction; ATs artificial tears; PFAT's preservative free artificial tears; NSC nuclear sclerotic cataract; PSC posterior subcapsular cataract; ERM epi-retinal membrane; PVD posterior vitreous detachment; RD retinal detachment; DM diabetes mellitus; DR diabetic retinopathy; NPDR non-proliferative diabetic retinopathy; PDR proliferative diabetic retinopathy; CSME clinically significant macular edema; DME diabetic macular edema; dbh dot blot hemorrhages; CWS cotton wool spot; POAG primary open angle glaucoma; C/D cup-to-disc ratio; HVF humphrey visual field; GVF goldmann visual field; OCT optical coherence tomography; IOP intraocular pressure; BRVO Branch retinal vein occlusion; CRVO central retinal vein occlusion; CRAO central retinal artery occlusion; BRAO branch retinal artery occlusion; RT retinal tear; SB scleral buckle; PPV pars plana vitrectomy; VH Vitreous hemorrhage; PRP  panretinal laser photocoagulation; IVK intravitreal kenalog; VMT vitreomacular traction; MH Macular hole;  NVD neovascularization of the disc; NVE neovascularization elsewhere; AREDS age related eye disease study; ARMD age related macular degeneration; POAG primary open angle glaucoma; EBMD epithelial/anterior basement membrane dystrophy; ACIOL anterior chamber intraocular lens; IOL intraocular lens; PCIOL posterior chamber intraocular lens; Phaco/IOL phacoemulsification with intraocular lens placement; PRK photorefractive keratectomy; LASIK laser assisted in situ keratomileusis; HTN hypertension; DM diabetes mellitus; COPD chronic obstructive pulmonary disease

## 2020-02-12 DIAGNOSIS — F900 Attention-deficit hyperactivity disorder, predominantly inattentive type: Secondary | ICD-10-CM | POA: Diagnosis not present

## 2020-02-15 ENCOUNTER — Ambulatory Visit (INDEPENDENT_AMBULATORY_CARE_PROVIDER_SITE_OTHER): Payer: PRIVATE HEALTH INSURANCE | Admitting: Ophthalmology

## 2020-02-15 ENCOUNTER — Encounter (INDEPENDENT_AMBULATORY_CARE_PROVIDER_SITE_OTHER): Payer: Self-pay | Admitting: Ophthalmology

## 2020-02-15 ENCOUNTER — Other Ambulatory Visit: Payer: Self-pay

## 2020-02-15 DIAGNOSIS — H40053 Ocular hypertension, bilateral: Secondary | ICD-10-CM

## 2020-02-15 DIAGNOSIS — E113313 Type 2 diabetes mellitus with moderate nonproliferative diabetic retinopathy with macular edema, bilateral: Secondary | ICD-10-CM

## 2020-02-15 DIAGNOSIS — H25813 Combined forms of age-related cataract, bilateral: Secondary | ICD-10-CM

## 2020-02-15 DIAGNOSIS — H3581 Retinal edema: Secondary | ICD-10-CM

## 2020-02-15 DIAGNOSIS — H35033 Hypertensive retinopathy, bilateral: Secondary | ICD-10-CM

## 2020-02-15 MED ORDER — PREDNISOLONE ACETATE 1 % OP SUSP: 1.0000 [drp] | Freq: Four times a day (QID) | OPHTHALMIC | 0 refills | Status: AC

## 2020-02-25 NOTE — Progress Notes (Signed)
[facility] - Clinic Note  02/29/2020  CHIEF COMPLAINT Patient presents for Retina Follow Up  HISTORY OF PRESENT ILLNESS: Derrick Terry is a 59 y.o. male who presents to the clinic today for:   HPI    Retina Follow Up    Patient presents with  Diabetic Retinopathy.  In both eyes.  This started weeks ago.  Severity is moderate.  Duration of weeks.  Since onset it is stable.  I, the attending physician,  performed the HPI with the patient and updated documentation appropriately.          Comments    Pt states his vision is stable OU.  Patient denies eye pain or discomfort and denies any new or worsening floaters or fol OU.       Last edited by [name], MD on 02/29/2020 12:03 PM. (History)       Referring physician: [name], MD [phone] [place],  [place] [address]  HISTORICAL INFORMATION:   Selected notes from the MEDICAL RECORD NUMBER DEE - Referred by PCP, Dr. [name]   CURRENT MEDICATIONS: Current Outpatient Medications (Ophthalmic Drugs)  Medication Sig  . brimonidine (ALPHAGAN) 0.2 % ophthalmic solution Place 1 drop into both eyes 2 (two) times daily.   No current facility-administered medications for this visit. (Ophthalmic Drugs)   Current Outpatient Medications (Other)  Medication Sig  . atorvastatin (LIPITOR) 10 MG tablet Take 10 mg by mouth daily.  . B-D UF III MINI PEN NEEDLES 31G X 5 MM MISC USE TO ADMINISTER VICTOZA ONCE A DAY  . celecoxib (CELEBREX) 200 MG capsule Take 200 mg by mouth daily.  . CONTOUR NEXT TEST test strip USE STRIP TO CHECK GLUCOSE ONCE DAILY  . cyclobenzaprine (FLEXERIL) 10 MG tablet Take 1 tablet (10 mg total) by mouth 2 (two) times daily as needed.  . Empagliflozin-metFORMIN HCl (SYNJARDY PO) Take by mouth.  . levocetirizine (XYZAL) 5 MG tablet Take 5 mg by mouth every evening.  . lidocaine (XYLOCAINE) 5 % ointment Apply topically as needed.  . Liraglutide (VICTOZA [place])  Inject into the skin daily.  . metFORMIN (GLUCOPHAGE) 1000 MG tablet Take 1,000 mg by mouth 2 (two) times daily with a meal.  . Microlet Lancets MISC USE TO CHECK GLUCOSE ONCE DAILY  . phenazopyridine (PYRIDIUM) 100 MG tablet Take 100 mg by mouth 3 (three) times daily as needed.  . pioglitazone (ACTOS) 45 MG tablet Take 45 mg by mouth daily.  . predniSONE (STERAPRED UNI-PAK 48 TAB) 5 MG (48) TBPK tablet See admin instructions.  . quinapril (ACCUPRIL) 10 MG tablet Take 10 mg by mouth daily.  . tadalafil (CIALIS) 20 MG tablet Take 20 mg by mouth daily as needed.  . valACYclovir (VALTREX) 1000 MG tablet SMARTSIG:2 Tablet(s) By Mouth Every 12 Hours PRN  . valACYclovir (VALTREX) 500 MG tablet Take 500 mg by mouth as needed.   Marland Kitchen VICTOZA 18 MG/3ML SOPN Inject 1.8 mg into the skin daily.   No current facility-administered medications for this visit. (Other)      REVIEW OF SYSTEMS: ROS    Positive for: Endocrine, Eyes   Negative for: Constitutional, Gastrointestinal, Neurological, Skin, Genitourinary, Musculoskeletal, HENT, Cardiovascular, Respiratory, Psychiatric, Allergic/Imm, Heme/Lymph   Last edited by Corrinne Eagle on 02/29/2020  9:16 AM. (History)       ALLERGIES Allergies  Allergen Reactions  . Toradol [Ketorolac Tromethamine]     itching    PAST MEDICAL HISTORY Past Medical  History:  Diagnosis Date  . Diabetes mellitus without complication (HCC)   . Diabetic retinopathy (HCC)   . ED (erectile dysfunction)   . Hematuria   . Herpes labialis    Past Surgical History:  Procedure Laterality Date  . KNEE ARTHROSCOPY Left   . KNEE ARTHROSCOPY Right     FAMILY HISTORY History reviewed. No pertinent family history.  SOCIAL HISTORY Social History   Tobacco Use  . Smoking status: Never Smoker  . Smokeless tobacco: Never Used  Substance Use Topics  . Alcohol use: No  . Drug use: No         OPHTHALMIC EXAM:  Base Eye Exam    Visual Acuity (Snellen - Linear)       Right Left   Dist cc 20/40 -2 20/25 -2   Dist ph cc NI NI   Correction: Glasses       Tonometry (Tonopen, 9:20 AM)      Right Left   Pressure 22 21       Pupils      Dark Light Shape React APD   Right 3 2 Round Brisk 0   Left 3 2 Round Brisk 0       Visual Fields      Left Right    Full Full       Extraocular Movement      Right Left    Full Full       Neuro/Psych    Oriented x3: Yes   Mood/Affect: Normal       Dilation    Both eyes: 1.0% Mydriacyl, 2.5% Phenylephrine @ 9:20 AM        Slit Lamp and Fundus Exam    Slit Lamp Exam      Right Left   Lids/Lashes Mild Meibomian gland dysfunction Mild Meibomian gland dysfunction   Conjunctiva/Sclera Mild Melanosis Mild Melanosis   Cornea Debris in tear film, EBMD Trace Punctate epithelial erosions, Debris in tear film   Anterior Chamber Deep and clear, narrow temporal angle Deep and clear, narrow temporal angle   Iris Round and dilated, No NVI Round and dilated, No NVI   Lens 2+ Nuclear sclerosis, 2-3+ Cortical cataract 2-3+ Nuclear sclerosis, 3+ Cortical cataract   Vitreous Vitreous syneresis Vitreous syneresis       Fundus Exam      Right Left   Disc Mild Pallor, Sharp rim Mild Pallor, Sharp rim   C/D Ratio 0.5 0.5   Macula Blunted foveal reflex, persistent central cystic changes, scattered Microaneurysms / IRH greatest temporally, focal pigment clump ST macula Flat, Good foveal reflex, trace persistent cystic changes inferiorly -- improved from prior, scattered Microaneurysms and IRH - improved, focal laser changes   Vessels Vascular attenuation, mild Tortuousity Vascular attenuation, mild Tortuousity   Periphery Attached, scattered MA  Attached, large pigmented CR scar IT periphery, scattered MA / IRH, focal blot heme at 1000 off disc          IMAGING AND PROCEDURES  Imaging and Procedures for @TODAY @  OCT, Retina - OU - Both Eyes       Right Eye Quality was good. Central Foveal Thickness: 402.  Progression has been stable. Findings include abnormal foveal contour, intraretinal fluid, no SRF, intraretinal hyper-reflective material (persistent central cyst -- ?slight increase).   Left Eye Quality was good. Central Foveal Thickness: 280. Progression has improved. Findings include normal foveal contour, intraretinal fluid, no SRF (Interval decrease in IRF  IT macula (post focal laser)).  Notes *Images captured and stored on drive  Diagnosis / Impression:  +DME OU (OD>OS) OD: persistent central cyst -- ?slight increase OS: Interval decrease in IRF IT macula (post focal laser)  Clinical management:  See below  Abbreviations: NFP - Normal foveal profile. CME - cystoid macular edema. PED - pigment epithelial detachment. IRF - intraretinal fluid. SRF - subretinal fluid. EZ - ellipsoid zone. ERM - epiretinal membrane. ORA - outer retinal atrophy. ORT - outer retinal tubulation. SRHM - subretinal hyper-reflective material        Intravitreal Injection, Pharmacologic Agent - OD - Right Eye       Time Out 02/29/2020. 10:07 AM. Confirmed correct patient, procedure, site, and patient consented.   Anesthesia Topical anesthesia was used. Anesthetic medications included Lidocaine 2%, Proparacaine 0.5%.   Procedure Preparation included 5% betadine to ocular surface, eyelid speculum. A (32g) needle was used.   Injection:  2 mg aflibercept Gretta Cool(EYLEA) SOLN   NDC: [id number], Lot: [id number], Expiration date: 05/04/2020   Route: Intravitreal, Site: Right Eye, Waste: 0.05 mL  Post-op Post injection exam found visual acuity of at least counting fingers. The patient tolerated the procedure well. There were no complications. The patient received written and verbal post procedure care education.                 ASSESSMENT/PLAN:    ICD-10-CM   1. Moderate nonproliferative diabetic retinopathy of both eyes with macular edema associated with type 2 diabetes mellitus (HCC)  [id number]  Intravitreal Injection, Pharmacologic Agent - OD - Right Eye    aflibercept (EYLEA) SOLN 2 mg  2. Retinal edema  H35.81 OCT, Retina - OU - Both Eyes  3. Combined forms of age-related cataract of both eyes  H25.813   4. Ocular hypertension, bilateral  H40.053   5. Hypertensive retinopathy of both eyes  H35.033     1,2. Moderate Non-proliferative diabetic retinopathy, OU             - S/P IVA OD #1 (11.03.20), #2 (12.01.20), #3 (01.15.21), #4 (02.12.21)  - S/P IVA OS #1 (11.16.20), #2 (01.15.21), #3 (02.12.21), #4 (03.12.21), #5 (04.09.21)  - discussed possible resistance to IVA OD and possible switch in medication  - S/P IVE OD #1 (03.12.21), #2 (04.09.21), #3 (05.13.21), #4 (06.10.21), #5 (07.15.21), #6 (08.30.21)             - S/P IVE OS #1 (05.13.21), #2 (06.10.21), #3 (07.15.21), #4 (08.30.21)             - S/P focal laser (09.13.21)  - FA (11.16.2020) shows late leaking MA OU, mild peripheral nonperfusion OU, no NV OU  - exam shows scattered MA, IRH, clustered centrally OU  - OCT shows persistent IRF -- ?slight increase OD; OS:  Interval decrease in IRF IT macula (post focal laser)  - recommend IVE OD #7 today, 09.27.21  - pt wishes to proceed with injection  - RBA of procedure discussed, questions answered  - Avastin informed consent form signed and scanned on 01.15.2021  - Eylea informed consent form signed and scanned on 03.12.2021  - see procedure note  - Eylea4U benefits investigation started 02.12.21 -- approved for 2021 (+CoPay card)  - f/u 4 weeks -- DFE/OCT, possible injection(s)  3. Mixed form age related cataract OU  - The symptoms of cataract, surgical options, and treatments and risks were discussed with patient.  - discussed diagnosis and progression  - not yet visually significant  - monitor for now  4.  Ocular Hypertension OU  - IOP today -- 22,21  - cont brimonidine BID OU   Ophthalmic Meds Ordered this visit:  Meds ordered this encounter  Medications  .  aflibercept (EYLEA) SOLN 2 mg   Return in about 4 weeks (around 03/28/2020) for DME OU - Dilated Exam, OCT, Possible Injxn.  There are no Patient Instructions on file for this visit.  This document serves as a record of services personally performed by Karie Chimera, MD, PhD. It was created on their behalf by Herby Abraham, COA, an ophthalmic technician. The creation of this record is the provider's dictation and/or activities during the visit.    Electronically signed by: Herby Abraham, COA 09.23.2021 12:07 PM   This document serves as a record of services personally performed by Karie Chimera, MD, PhD. It was created on their behalf by Glee Arvin. Manson Passey, OA an ophthalmic technician. The creation of this record is the provider's dictation and/or activities during the visit.    Electronically signed by: Glee Arvin. Kristopher Oppenheim 09.27.2021 12:07 PM  Karie Chimera, M.D., Ph.D. Diseases & Surgery of the Retina and Vitreous [facility] 02/29/2020   I have reviewed the above documentation for accuracy and completeness, and I agree with the above. Karie Chimera, M.D., Ph.D. 02/29/20 12:07 PM   Abbreviations: M myopia (nearsighted); A astigmatism; H hyperopia (farsighted); P presbyopia; Mrx spectacle prescription;  CTL contact lenses; OD right eye; OS left eye; OU both eyes  XT exotropia; ET esotropia; PEK punctate epithelial keratitis; PEE punctate epithelial erosions; DES dry eye syndrome; MGD meibomian gland dysfunction; ATs artificial tears; PFAT's preservative free artificial tears; NSC nuclear sclerotic cataract; PSC posterior subcapsular cataract; ERM epi-retinal membrane; PVD posterior vitreous detachment; RD retinal detachment; DM diabetes mellitus; DR diabetic retinopathy; NPDR non-proliferative diabetic retinopathy; PDR proliferative diabetic retinopathy; CSME clinically significant macular edema; DME diabetic macular edema; dbh dot blot hemorrhages; CWS cotton  wool spot; POAG primary open angle glaucoma; C/D cup-to-disc ratio; HVF humphrey visual field; GVF goldmann visual field; OCT optical coherence tomography; IOP intraocular pressure; BRVO Branch retinal vein occlusion; CRVO central retinal vein occlusion; CRAO central retinal artery occlusion; BRAO branch retinal artery occlusion; RT retinal tear; SB scleral buckle; PPV pars plana vitrectomy; VH Vitreous hemorrhage; PRP panretinal laser photocoagulation; IVK intravitreal kenalog; VMT vitreomacular traction; MH Macular hole;  NVD neovascularization of the disc; NVE neovascularization elsewhere; AREDS age related eye disease study; ARMD age related macular degeneration; POAG primary open angle glaucoma; EBMD epithelial/anterior basement membrane dystrophy; ACIOL anterior chamber intraocular lens; IOL intraocular lens; PCIOL posterior chamber intraocular lens; Phaco/IOL phacoemulsification with intraocular lens placement; PRK photorefractive keratectomy; LASIK laser assisted in situ keratomileusis; HTN hypertension; DM diabetes mellitus; COPD chronic obstructive pulmonary disease

## 2020-02-29 ENCOUNTER — Encounter (INDEPENDENT_AMBULATORY_CARE_PROVIDER_SITE_OTHER): Payer: Self-pay | Admitting: Ophthalmology

## 2020-02-29 ENCOUNTER — Other Ambulatory Visit: Payer: Self-pay

## 2020-02-29 ENCOUNTER — Ambulatory Visit (INDEPENDENT_AMBULATORY_CARE_PROVIDER_SITE_OTHER): Payer: PRIVATE HEALTH INSURANCE | Admitting: Ophthalmology

## 2020-02-29 DIAGNOSIS — H40053 Ocular hypertension, bilateral: Secondary | ICD-10-CM | POA: Diagnosis not present

## 2020-02-29 DIAGNOSIS — H3581 Retinal edema: Secondary | ICD-10-CM | POA: Diagnosis not present

## 2020-02-29 DIAGNOSIS — H25813 Combined forms of age-related cataract, bilateral: Secondary | ICD-10-CM

## 2020-02-29 DIAGNOSIS — E113313 Type 2 diabetes mellitus with moderate nonproliferative diabetic retinopathy with macular edema, bilateral: Secondary | ICD-10-CM

## 2020-02-29 DIAGNOSIS — H35033 Hypertensive retinopathy, bilateral: Secondary | ICD-10-CM

## 2020-02-29 MED ORDER — AFLIBERCEPT 2MG/0.05ML IZ SOLN FOR KALEIDOSCOPE
2.0000 mg | INTRAVITREAL | Status: AC | PRN
  Administered 2020-02-29: 2 mg via INTRAVITREAL

## 2020-03-23 NOTE — Progress Notes (Signed)
[facility] - Clinic Note  03/28/2020  CHIEF COMPLAINT Patient presents for Retina Follow Up  HISTORY OF PRESENT ILLNESS: Derrick Terry is a 59 y.o. male who presents to the clinic today for:   HPI    Retina Follow Up    Patient presents with  Diabetic Retinopathy.  In both eyes.  This started 4 weeks ago.  I, the attending physician,  performed the HPI with the patient and updated documentation appropriately.          Comments    Patient here for 4 weeks retina follow up for NPDR OU. Patient states vision doing ok. No eye pain.        Last edited by [name], Arlisa Leclere, MD on 03/28/2020  9:30 AM. (History)       Referring physician: Johny BlamerHarris, William, MD [phone] [address] [place],  [place] [id number]  HISTORICAL INFORMATION:   Selected notes from the MEDICAL RECORD NUMBER DEE - Referred by PCP, [name]   CURRENT MEDICATIONS: Current Outpatient Medications (Ophthalmic Drugs)  Medication Sig  . brimonidine (ALPHAGAN) 0.2 % ophthalmic solution Place 1 drop into both eyes 2 (two) times daily.   No current facility-administered medications for this visit. (Ophthalmic Drugs)   Current Outpatient Medications (Other)  Medication Sig  . atorvastatin (LIPITOR) 10 MG tablet Take 10 mg by mouth daily.  . B-D UF III MINI PEN NEEDLES 31G X 5 MM MISC USE TO ADMINISTER VICTOZA ONCE A DAY  . celecoxib (CELEBREX) 200 MG capsule Take 200 mg by mouth daily.  . CONTOUR NEXT TEST test strip USE STRIP TO CHECK GLUCOSE ONCE DAILY  . cyclobenzaprine (FLEXERIL) 10 MG tablet Take 1 tablet (10 mg total) by mouth 2 (two) times daily as needed.  . Empagliflozin-metFORMIN HCl (SYNJARDY PO) Take by mouth.  . levocetirizine (XYZAL) 5 MG tablet Take 5 mg by mouth every evening.  . lidocaine (XYLOCAINE) 5 % ointment Apply topically as needed.  . Liraglutide (VICTOZA [place]) Inject into the skin daily.  . metFORMIN (GLUCOPHAGE) 1000 MG tablet Take 1,000 mg by mouth 2  (two) times daily with a meal.  . Microlet Lancets MISC USE TO CHECK GLUCOSE ONCE DAILY  . phenazopyridine (PYRIDIUM) 100 MG tablet Take 100 mg by mouth 3 (three) times daily as needed.  . pioglitazone (ACTOS) 45 MG tablet Take 45 mg by mouth daily.  . predniSONE (STERAPRED UNI-PAK 48 TAB) 5 MG (48) TBPK tablet See admin instructions.  . quinapril (ACCUPRIL) 10 MG tablet Take 10 mg by mouth daily.  . tadalafil (CIALIS) 20 MG tablet Take 20 mg by mouth daily as needed.  . valACYclovir (VALTREX) 1000 MG tablet SMARTSIG:2 Tablet(s) By Mouth Every 12 Hours PRN  . valACYclovir (VALTREX) 500 MG tablet Take 500 mg by mouth as needed.   Marland Kitchen. VICTOZA 18 MG/3ML SOPN Inject 1.8 mg into the skin daily.   No current facility-administered medications for this visit. (Other)      REVIEW OF SYSTEMS: ROS    Positive for: Endocrine, Eyes   Negative for: Constitutional, Gastrointestinal, Neurological, Skin, Genitourinary, Musculoskeletal, HENT, Cardiovascular, Respiratory, Psychiatric, Allergic/Imm, Heme/Lymph   Last edited by Laddie Aquaslarke, Rebecca S, COA on 03/28/2020  8:35 AM. (History)       ALLERGIES Allergies  Allergen Reactions  . Toradol [Ketorolac Tromethamine]     itching    PAST MEDICAL HISTORY Past Medical History:  Diagnosis Date  . Diabetes mellitus without complication (HCC)   . Diabetic  retinopathy (HCC)   . ED (erectile dysfunction)   . Hematuria   . Herpes labialis    Past Surgical History:  Procedure Laterality Date  . KNEE ARTHROSCOPY Left   . KNEE ARTHROSCOPY Right     FAMILY HISTORY History reviewed. No pertinent family history.  SOCIAL HISTORY Social History   Tobacco Use  . Smoking status: Never Smoker  . Smokeless tobacco: Never Used  Substance Use Topics  . Alcohol use: No  . Drug use: No         OPHTHALMIC EXAM:  Base Eye Exam    Visual Acuity (Snellen - Linear)      Right Left   Dist cc 20/30 -1 20/25 -2   Dist ph cc NI 20/25 +1   Correction:  Glasses       Tonometry (Tonopen, 8:32 AM)      Right Left   Pressure 15 13       Pupils      Dark Light Shape React APD   Right 3 2 Round Brisk None   Left 3 2 Round Brisk None       Visual Fields (Counting fingers)      Left Right    Full Full       Extraocular Movement      Right Left    Full Full       Neuro/Psych    Oriented x3: Yes   Mood/Affect: Normal       Dilation    Both eyes: 1.0% Mydriacyl, 2.5% Phenylephrine @ 8:32 AM        Slit Lamp and Fundus Exam    Slit Lamp Exam      Right Left   Lids/Lashes Mild Meibomian gland dysfunction Mild Meibomian gland dysfunction   Conjunctiva/Sclera Mild Melanosis Mild Melanosis   Cornea Debris in tear film, EBMD Trace Punctate epithelial erosions, Debris in tear film   Anterior Chamber Deep and clear, narrow temporal angle Deep and clear, narrow temporal angle   Iris Round and dilated, No NVI Round and dilated, No NVI   Lens 2+ Nuclear sclerosis, 2-3+ Cortical cataract 2-3+ Nuclear sclerosis, 3+ Cortical cataract   Vitreous Vitreous syneresis Vitreous syneresis       Fundus Exam      Right Left   Disc Pink and sharp, Mild Pallor, Sharp rim, +cupping Pink and sharp, central Pallor, Sharp rim, +cupping   C/D Ratio 0.5 0.5   Macula Blunted foveal reflex, persistent central cystic changes -- slighlty improved, scattered Microaneurysms / IRH greatest temporally, focal pigment clump ST macula Flat, Good foveal reflex, trace persistent cystic changes inferiorly -- improved from prior, scattered Microaneurysms and IRH - improved, focal laser changes, focal DBH inferior macula   Vessels Vascular attenuation, mild Tortuousity Vascular attenuation, mild Tortuousity   Periphery Attached, scattered MA  Attached, large pigmented CR scar IT periphery, scattered MA / IRH, focal blot heme at 1000 off disc          IMAGING AND PROCEDURES  Imaging and Procedures for @TODAY @  OCT, Retina - OU - Both Eyes       Right  Eye Quality was good. Central Foveal Thickness: 361. Progression has improved. Findings include abnormal foveal contour, intraretinal fluid, no SRF, intraretinal hyper-reflective material (Interval improvement in central cysts).   Left Eye Quality was good. Central Foveal Thickness: 278. Progression has improved. Findings include normal foveal contour, intraretinal fluid, no SRF (Interval improvement in cystic changes IT macula (post focal laser)).  Notes *Images captured and stored on drive  Diagnosis / Impression:  +DME OU (OD>OS) OD: Interval improvement in central cysts OS: Interval improvement in cystic changes IT macula (post focal laser)  Clinical management:  See below  Abbreviations: NFP - Normal foveal profile. CME - cystoid macular edema. PED - pigment epithelial detachment. IRF - intraretinal fluid. SRF - subretinal fluid. EZ - ellipsoid zone. ERM - epiretinal membrane. ORA - outer retinal atrophy. ORT - outer retinal tubulation. SRHM - subretinal hyper-reflective material        Intravitreal Injection, Pharmacologic Agent - OD - Right Eye       Time Out 03/28/2020. 9:03 AM. Confirmed correct patient, procedure, site, and patient consented.   Anesthesia Topical anesthesia was used. Anesthetic medications included Lidocaine 2%, Proparacaine 0.5%.   Procedure Preparation included 5% betadine to ocular surface, eyelid speculum. A (32g) needle was used.   Injection:  2 mg aflibercept Gretta Cool) SOLN   NDC: L6038910, Lot: [id number], Expiration date: 06/04/2020   Route: Intravitreal, Site: Right Eye, Waste: 0.05 mg  Post-op Post injection exam found visual acuity of at least counting fingers. The patient tolerated the procedure well. There were no complications. The patient received written and verbal post procedure care education.                 ASSESSMENT/PLAN:    ICD-10-CM   1. Moderate nonproliferative diabetic retinopathy of both eyes with macular  edema associated with type 2 diabetes mellitus (HCC)  Q03.4742 Intravitreal Injection, Pharmacologic Agent - OD - Right Eye    aflibercept (EYLEA) SOLN 2 mg  2. Retinal edema  H35.81 OCT, Retina - OU - Both Eyes  3. Combined forms of age-related cataract of both eyes  H25.813   4. Ocular hypertension, bilateral  H40.053   5. Hypertensive retinopathy of both eyes  H35.033     1,2. Moderate Non-proliferative diabetic retinopathy, OU             - S/P IVA OD #1 (11.03.20), #2 (12.01.20), #3 (01.15.21), #4 (02.12.21)  - S/P IVA OS #1 (11.16.20), #2 (01.15.21), #3 (02.12.21), #4 (03.12.21), #5 (04.09.21)  - discussed possible resistance to IVA OD and possible switch in medication  - S/P IVE OD #1 (03.12.21), #2 (04.09.21), #3 (05.13.21), #4 (06.10.21), #5 (07.15.21), #6 (08.30.21), #7 (09.27.21)             - S/P IVE OS #1 (05.13.21), #2 (06.10.21), #3 (07.15.21), #4 (08.30.21)             - S/P focal laser (09.13.21)  - FA (11.16.2020) shows late leaking MA OU, mild peripheral nonperfusion OU, no NV OU  - exam shows scattered MA, IRH, clustered centrally OU  - OCT shows persistent central cyst OD; OS:  Interval improvement in cystic changes IT macula (post focal laser)  - recommend IVE OD #8 today, 10.25.21  - pt wishes to proceed with injection  - RBA of procedure discussed, questions answered  - Avastin informed consent form signed and scanned on 01.15.2021  - Eylea informed consent form signed and scanned on 03.12.2021  - see procedure note  - Eylea4U benefits investigation started 02.12.21 -- approved for 2021 (+CoPay card)  - f/u 1-2 weeks -- DFE/OCT, focal laser OD  3. Mixed form age related cataract OU  - The symptoms of cataract, surgical options, and treatments and risks were discussed with patient.  - discussed diagnosis and progression  - not yet visually significant  - monitor for now  4. Ocular Hypertension OU  - IOP today -- 15,13  - cont brimonidine BID  OU   Ophthalmic Meds Ordered this visit:  Meds ordered this encounter  Medications  . aflibercept (EYLEA) SOLN 2 mg   Return for f/u 1-2 weeks, NPDR OU, DFE, OCT, focal laser OD.  There are no Patient Instructions on file for this visit.  This document serves as a record of services personally performed by Karie Chimera, MD, PhD. It was created on their behalf by Herby Abraham, COA, an ophthalmic technician. The creation of this record is the provider's dictation and/or activities during the visit.    Electronically signed by: Herby Abraham, COA 10.20.2021 9:32 AM   This document serves as a record of services personally performed by Karie Chimera, MD, PhD. It was created on their behalf by Glee Arvin. Manson Passey, OA an ophthalmic technician. The creation of this record is the provider's dictation and/or activities during the visit.    Electronically signed by: Glee Arvin. Manson Passey, [place] 10.25.2021 9:32 AM  Karie Chimera, M.D., Ph.D. Diseases & Surgery of the Retina and Vitreous [facility] 03/28/2020   I have reviewed the above documentation for accuracy and completeness, and I agree with the above. Karie Chimera, M.D., Ph.D. 03/28/20 9:32 AM   Abbreviations: M myopia (nearsighted); A astigmatism; H hyperopia (farsighted); P presbyopia; Mrx spectacle prescription;  CTL contact lenses; OD right eye; OS left eye; OU both eyes  XT exotropia; ET esotropia; PEK punctate epithelial keratitis; PEE punctate epithelial erosions; DES dry eye syndrome; MGD meibomian gland dysfunction; ATs artificial tears; PFAT's preservative free artificial tears; NSC nuclear sclerotic cataract; PSC posterior subcapsular cataract; ERM epi-retinal membrane; PVD posterior vitreous detachment; RD retinal detachment; DM diabetes mellitus; DR diabetic retinopathy; NPDR non-proliferative diabetic retinopathy; PDR proliferative diabetic retinopathy; CSME clinically significant macular edema; DME  diabetic macular edema; dbh dot blot hemorrhages; CWS cotton wool spot; POAG primary open angle glaucoma; C/D cup-to-disc ratio; HVF humphrey visual field; GVF goldmann visual field; OCT optical coherence tomography; IOP intraocular pressure; BRVO Branch retinal vein occlusion; CRVO central retinal vein occlusion; CRAO central retinal artery occlusion; BRAO branch retinal artery occlusion; RT retinal tear; SB scleral buckle; PPV pars plana vitrectomy; VH Vitreous hemorrhage; PRP panretinal laser photocoagulation; IVK intravitreal kenalog; VMT vitreomacular traction; MH Macular hole;  NVD neovascularization of the disc; NVE neovascularization elsewhere; AREDS age related eye disease study; ARMD age related macular degeneration; POAG primary open angle glaucoma; EBMD epithelial/anterior basement membrane dystrophy; ACIOL anterior chamber intraocular lens; IOL intraocular lens; PCIOL posterior chamber intraocular lens; Phaco/IOL phacoemulsification with intraocular lens placement; PRK photorefractive keratectomy; LASIK laser assisted in situ keratomileusis; HTN hypertension; DM diabetes mellitus; COPD chronic obstructive pulmonary disease

## 2020-03-28 ENCOUNTER — Ambulatory Visit (INDEPENDENT_AMBULATORY_CARE_PROVIDER_SITE_OTHER): Payer: PRIVATE HEALTH INSURANCE | Admitting: Ophthalmology

## 2020-03-28 ENCOUNTER — Encounter (INDEPENDENT_AMBULATORY_CARE_PROVIDER_SITE_OTHER): Payer: Self-pay | Admitting: Ophthalmology

## 2020-03-28 ENCOUNTER — Other Ambulatory Visit: Payer: Self-pay

## 2020-03-28 DIAGNOSIS — H25813 Combined forms of age-related cataract, bilateral: Secondary | ICD-10-CM | POA: Diagnosis not present

## 2020-03-28 DIAGNOSIS — H40053 Ocular hypertension, bilateral: Secondary | ICD-10-CM

## 2020-03-28 DIAGNOSIS — H3581 Retinal edema: Secondary | ICD-10-CM | POA: Diagnosis not present

## 2020-03-28 DIAGNOSIS — H35033 Hypertensive retinopathy, bilateral: Secondary | ICD-10-CM

## 2020-03-28 DIAGNOSIS — E113313 Type 2 diabetes mellitus with moderate nonproliferative diabetic retinopathy with macular edema, bilateral: Secondary | ICD-10-CM

## 2020-03-28 DIAGNOSIS — N1831 Chronic kidney disease, stage 3a: Secondary | ICD-10-CM | POA: Diagnosis not present

## 2020-03-28 MED ORDER — AFLIBERCEPT 2MG/0.05ML IZ SOLN FOR KALEIDOSCOPE
2.0000 mg | INTRAVITREAL | Status: AC | PRN
  Administered 2020-03-28: 2 mg via INTRAVITREAL

## 2020-03-31 NOTE — Progress Notes (Signed)
[facility] - Clinic Note  04/04/2020  CHIEF COMPLAINT Patient presents for Retina Follow Up  HISTORY OF PRESENT ILLNESS: Derrick Terry is a 59 y.o. male who presents to the clinic today for:   HPI    Retina Follow Up    Patient presents with  Diabetic Retinopathy.  In both eyes.  Duration of 1 week.  Since onset it is stable.  I, the attending physician,  performed the HPI with the patient and updated documentation appropriately.          Comments    1 week follow up NPDR OU, focal laser today.  Pt states vision stable OU. Taking Brimonidine qd OU should be BID (he forgets). BS: 108 last night A1C: 7.7 just had last week unsure results yet.       Last edited by [name], MD on 04/04/2020 11:23 AM. (History)    pt here for focal laser OD today   Referring physician: [name], MD [phone] [address] [place],  [place] [address]  HISTORICAL INFORMATION:   Selected notes from the MEDICAL RECORD NUMBER DEE - Referred by PCP, Dr. [name]   CURRENT MEDICATIONS: Current Outpatient Medications (Ophthalmic Drugs)  Medication Sig  . brimonidine (ALPHAGAN) 0.2 % ophthalmic solution Place 1 drop into both eyes 2 (two) times daily.  . prednisoLONE acetate (PRED FORTE) 1 % ophthalmic suspension Place 1 drop into the right eye 4 (four) times daily.   No current facility-administered medications for this visit. (Ophthalmic Drugs)   Current Outpatient Medications (Other)  Medication Sig  . atorvastatin (LIPITOR) 10 MG tablet Take 10 mg by mouth daily.  . celecoxib (CELEBREX) 200 MG capsule Take 200 mg by mouth daily.  . cyclobenzaprine (FLEXERIL) 10 MG tablet Take 1 tablet (10 mg total) by mouth 2 (two) times daily as needed.  . Empagliflozin-metFORMIN HCl (SYNJARDY PO) Take by mouth.  . levocetirizine (XYZAL) 5 MG tablet Take 5 mg by mouth every evening.  . lidocaine (XYLOCAINE) 5 % ointment Apply topically as needed.  . Liraglutide  (VICTOZA [place]) Inject into the skin daily.  . metFORMIN (GLUCOPHAGE) 1000 MG tablet Take 1,000 mg by mouth 2 (two) times daily with a meal.  . phenazopyridine (PYRIDIUM) 100 MG tablet Take 100 mg by mouth 3 (three) times daily as needed.  . pioglitazone (ACTOS) 45 MG tablet Take 45 mg by mouth daily.  . quinapril (ACCUPRIL) 10 MG tablet Take 10 mg by mouth daily.  . tadalafil (CIALIS) 20 MG tablet Take 20 mg by mouth daily as needed.  . valACYclovir (VALTREX) 1000 MG tablet SMARTSIG:2 Tablet(s) By Mouth Every 12 Hours PRN  . valACYclovir (VALTREX) 500 MG tablet Take 500 mg by mouth as needed.   Marland Kitchen VICTOZA 18 MG/3ML SOPN Inject 1.8 mg into the skin daily.  . B-D UF III MINI PEN NEEDLES 31G X 5 MM MISC USE TO ADMINISTER VICTOZA ONCE A DAY  . CONTOUR NEXT TEST test strip USE STRIP TO CHECK GLUCOSE ONCE DAILY  . Microlet Lancets MISC USE TO CHECK GLUCOSE ONCE DAILY  . predniSONE (STERAPRED UNI-PAK 48 TAB) 5 MG (48) TBPK tablet See admin instructions. (Patient not taking: Reported on 04/04/2020)   No current facility-administered medications for this visit. (Other)      REVIEW OF SYSTEMS: ROS    Positive for: Endocrine, Eyes   Negative for: Constitutional, Gastrointestinal, Neurological, Skin, Genitourinary, Musculoskeletal, HENT, Cardiovascular, Respiratory, Psychiatric, Allergic/Imm, Heme/Lymph   Last edited by  Joni ReiningHodges, Robin, COA on 04/04/2020  9:47 AM. (History)       ALLERGIES Allergies  Allergen Reactions  . Toradol [Ketorolac Tromethamine]     itching    PAST MEDICAL HISTORY Past Medical History:  Diagnosis Date  . Diabetes mellitus without complication (HCC)   . Diabetic retinopathy (HCC)   . ED (erectile dysfunction)   . Hematuria   . Herpes labialis    Past Surgical History:  Procedure Laterality Date  . KNEE ARTHROSCOPY Left   . KNEE ARTHROSCOPY Right     FAMILY HISTORY History reviewed. No pertinent family history.  SOCIAL HISTORY Social History   Tobacco Use   . Smoking status: Never Smoker  . Smokeless tobacco: Never Used  Substance Use Topics  . Alcohol use: No  . Drug use: No         OPHTHALMIC EXAM:  Base Eye Exam    Visual Acuity (Snellen - Linear)      Right Left   Dist cc 20/30 20/30 +2   Dist ph cc NI 20/25       Tonometry (Tonopen, 9:54 AM)      Right Left   Pressure 21 24  Did not use Brimonidine this morning.       Pupils      Dark Light Shape React APD   Right 3 2 Round Brisk None   Left 3 2 Round Brisk None       Visual Fields (Counting fingers)      Left Right    Full Full       Extraocular Movement      Right Left    Full Full       Neuro/Psych    Oriented x3: Yes   Mood/Affect: Normal       Dilation    Both eyes: 1.0% Mydriacyl, 2.5% Phenylephrine @ 9:54 AM        Slit Lamp and Fundus Exam    Slit Lamp Exam      Right Left   Lids/Lashes Mild Meibomian gland dysfunction Mild Meibomian gland dysfunction   Conjunctiva/Sclera Mild Melanosis Mild Melanosis   Cornea Debris in tear film, EBMD Trace Punctate epithelial erosions, Debris in tear film   Anterior Chamber Deep and clear, narrow temporal angle Deep and clear, narrow temporal angle   Iris Round and dilated, No NVI Round and dilated, No NVI   Lens 2+ Nuclear sclerosis, 2-3+ Cortical cataract 2-3+ Nuclear sclerosis, 3+ Cortical cataract   Vitreous Vitreous syneresis Vitreous syneresis       Fundus Exam      Right Left   Disc Pink and sharp, Mild Pallor, Sharp rim, +cupping Pink and sharp, central Pallor, Sharp rim, +cupping   C/D Ratio 0.5 0.5   Macula Blunted foveal reflex, persistent central cystic changes -- slighlty improved, scattered Microaneurysms / IRH greatest temporally, focal pigment clump ST macula Flat, Good foveal reflex, trace persistent cystic changes inferiorly -- improved from prior, scattered Microaneurysms and IRH - improved, focal laser changes, focal DBH inferior macula   Vessels Vascular attenuation, mild  Tortuousity Vascular attenuation, mild Tortuousity   Periphery Attached, scattered MA  Attached, large pigmented CR scar IT periphery, scattered MA / IRH, focal blot heme at 1000 off disc          IMAGING AND PROCEDURES  Imaging and Procedures for @TODAY @  OCT, Retina - OU - Both Eyes       Right Eye Quality was good. Central Foveal Thickness: 366.  Progression has worsened. Findings include abnormal foveal contour, intraretinal fluid, no SRF, intraretinal hyper-reflective material (Mild Interval increase in central cysts).   Left Eye Quality was good. Central Foveal Thickness: 280. Progression has been stable. Findings include normal foveal contour, intraretinal fluid, no SRF (Mild Interval improvement in cystic changes IT macula (post focal laser)).   Notes *Images captured and stored on drive  Diagnosis / Impression:  +DME OU (OD>OS) OD: Mild Interval increase in central cysts OS: mild Interval improvement in cystic changes IT macula (post focal laser)  Clinical management:  See below  Abbreviations: NFP - Normal foveal profile. CME - cystoid macular edema. PED - pigment epithelial detachment. IRF - intraretinal fluid. SRF - subretinal fluid. EZ - ellipsoid zone. ERM - epiretinal membrane. ORA - outer retinal atrophy. ORT - outer retinal tubulation. SRHM - subretinal hyper-reflective material        Focal Laser - OD - Right Eye       LASER PROCEDURE NOTE  Diagnosis:   Diabetic macular edema, RIGHT  EYE  Procedure:  Focal laser photocoagulation using slit lamp laser, RIGHT EYE  Anesthesia:  Topical  Surgeon: [name], MD, PhD   Informed consent obtained, operative eye marked, and time out performed prior to initiation of laser.   [place]is [id number] Focal/Grid laser Power: 85 mW Duration: 50 msec  Spot size: 100 microns  # spots: 84 spots placed to parafoveal MAs and in superotemporal arcade.  Complications: None.  RTC: 3 wks  Patient tolerated the  procedure well and received written and verbal post-procedure care information/education.                   ASSESSMENT/PLAN:    ICD-10-CM   1. Moderate nonproliferative diabetic retinopathy of both eyes with macular edema associated with type 2 diabetes mellitus (HCC)  A63.0160 Focal Laser - OD - Right Eye  2. Retinal edema  H35.81 OCT, Retina - OU - Both Eyes  3. Combined forms of age-related cataract of both eyes  H25.813   4. Ocular hypertension, bilateral  H40.053   5. Hypertensive retinopathy of both eyes  H35.033     1,2. Moderate Non-proliferative diabetic retinopathy, OU             - S/P IVA OD #1 (11.03.20), #2 (12.01.20), #3 (01.15.21), #4 (02.12.21)  - S/P IVA OS #1 (11.16.20), #2 (01.15.21), #3 (02.12.21), #4 (03.12.21), #5 (04.09.21)  - discussed possible resistance to IVA OD and possible switch in medication  - S/P IVE OD #1 (03.12.21), #2 (04.09.21), #3 (05.13.21), #4 (06.10.21), #5 (07.15.21), #6 (08.30.21), #7 (09.27.21)             - S/P IVE OS #1 (05.13.21), #2 (06.10.21), #3 (07.15.21), #4 (08.30.21)             - S/P focal laser OS (09.13.21)  - FA (11.16.2020) shows late leaking MA OU, mild peripheral nonperfusion OU, no NV OU  - exam shows scattered MA, IRH, clustered centrally OU  - OCT shows OD: Mild Interval increase in central cysts; OS: mild Interval improvement in cystic changes IT macula (post focal laser)  - recommend focal laser OD today, 11.01.21  - pt wishes to proceed with laser  - RBA of procedure discussed, questions answered  - Avastin informed consent form signed and scanned on 01.15.2021  - Eylea informed consent form signed and scanned on 03.12.2021  - see procedure note  - Eylea4U benefits investigation started 02.12.21 -- approved for 2021 (+[employer]  card)  - f/u 3 weeks -- DFE/OCT, possible injection(s)  3. Mixed form age related cataract OU  - The symptoms of cataract, surgical options, and treatments and risks were discussed with  patient.  - discussed diagnosis and progression  - not yet visually significant  - monitor for now  4. Ocular Hypertension OU  - IOP today -- 21,24  - cont brimonidine BID OU   Ophthalmic Meds Ordered this visit:  Meds ordered this encounter  Medications  . prednisoLONE acetate (PRED FORTE) 1 % ophthalmic suspension    Sig: Place 1 drop into the right eye 4 (four) times daily.    Dispense:  10 mL    Refill:  0   Return in about 3 weeks (around 04/25/2020) for f/u NPDR OU, DFE, OCT.  There are no Patient Instructions on file for this visit.  This document serves as a record of services personally performed by Karie Chimera, MD, PhD. It was created on their behalf by Herby Abraham, COA, an ophthalmic technician. The creation of this record is the provider's dictation and/or activities during the visit.    Electronically signed by: Herby Abraham, COA 10.28.2021 11:26 AM   This document serves as a record of services personally performed by Karie Chimera, MD, PhD. It was created on their behalf by Glee Arvin. Manson Passey, OA an ophthalmic technician. The creation of this record is the provider's dictation and/or activities during the visit.    Electronically signed by: Glee Arvin. Manson Passey, [place] 11.01.2021 11:26 AM  Karie Chimera, M.D., Ph.D. Diseases & Surgery of the Retina and Vitreous [facility] 04/04/2020   I have reviewed the above documentation for accuracy and completeness, and I agree with the above. Karie Chimera, M.D., Ph.D. 04/04/20 11:26 AM   Abbreviations: M myopia (nearsighted); A astigmatism; H hyperopia (farsighted); P presbyopia; Mrx spectacle prescription;  CTL contact lenses; OD right eye; OS left eye; OU both eyes  XT exotropia; ET esotropia; PEK punctate epithelial keratitis; PEE punctate epithelial erosions; DES dry eye syndrome; MGD meibomian gland dysfunction; ATs artificial tears; PFAT's preservative free artificial tears; NSC nuclear  sclerotic cataract; PSC posterior subcapsular cataract; ERM epi-retinal membrane; PVD posterior vitreous detachment; RD retinal detachment; DM diabetes mellitus; DR diabetic retinopathy; NPDR non-proliferative diabetic retinopathy; PDR proliferative diabetic retinopathy; CSME clinically significant macular edema; DME diabetic macular edema; dbh dot blot hemorrhages; CWS cotton wool spot; POAG primary open angle glaucoma; C/D cup-to-disc ratio; HVF humphrey visual field; GVF goldmann visual field; OCT optical coherence tomography; IOP intraocular pressure; BRVO Branch retinal vein occlusion; CRVO central retinal vein occlusion; CRAO central retinal artery occlusion; BRAO branch retinal artery occlusion; RT retinal tear; SB scleral buckle; PPV pars plana vitrectomy; VH Vitreous hemorrhage; PRP panretinal laser photocoagulation; IVK intravitreal kenalog; VMT vitreomacular traction; MH Macular hole;  NVD neovascularization of the disc; NVE neovascularization elsewhere; AREDS age related eye disease study; ARMD age related macular degeneration; POAG primary open angle glaucoma; EBMD epithelial/anterior basement membrane dystrophy; ACIOL anterior chamber intraocular lens; IOL intraocular lens; PCIOL posterior chamber intraocular lens; Phaco/IOL phacoemulsification with intraocular lens placement; PRK photorefractive keratectomy; LASIK laser assisted in situ keratomileusis; HTN hypertension; DM diabetes mellitus; COPD chronic obstructive pulmonary disease

## 2020-04-04 ENCOUNTER — Ambulatory Visit (INDEPENDENT_AMBULATORY_CARE_PROVIDER_SITE_OTHER): Payer: PRIVATE HEALTH INSURANCE | Admitting: Ophthalmology

## 2020-04-04 ENCOUNTER — Encounter (INDEPENDENT_AMBULATORY_CARE_PROVIDER_SITE_OTHER): Payer: Self-pay | Admitting: Ophthalmology

## 2020-04-04 ENCOUNTER — Other Ambulatory Visit: Payer: Self-pay

## 2020-04-04 DIAGNOSIS — H25813 Combined forms of age-related cataract, bilateral: Secondary | ICD-10-CM

## 2020-04-04 DIAGNOSIS — E113313 Type 2 diabetes mellitus with moderate nonproliferative diabetic retinopathy with macular edema, bilateral: Secondary | ICD-10-CM | POA: Diagnosis not present

## 2020-04-04 DIAGNOSIS — H40053 Ocular hypertension, bilateral: Secondary | ICD-10-CM

## 2020-04-04 DIAGNOSIS — H35033 Hypertensive retinopathy, bilateral: Secondary | ICD-10-CM

## 2020-04-04 DIAGNOSIS — H3581 Retinal edema: Secondary | ICD-10-CM

## 2020-04-04 MED ORDER — PREDNISOLONE ACETATE 1 % OP SUSP: 1.0000 [drp] | Freq: Four times a day (QID) | OPHTHALMIC | 0 refills | Status: AC

## 2020-04-26 ENCOUNTER — Encounter (INDEPENDENT_AMBULATORY_CARE_PROVIDER_SITE_OTHER): Payer: PRIVATE HEALTH INSURANCE | Admitting: Ophthalmology

## 2020-04-26 DIAGNOSIS — H3581 Retinal edema: Secondary | ICD-10-CM

## 2020-04-26 DIAGNOSIS — H35033 Hypertensive retinopathy, bilateral: Secondary | ICD-10-CM

## 2020-04-26 DIAGNOSIS — E113313 Type 2 diabetes mellitus with moderate nonproliferative diabetic retinopathy with macular edema, bilateral: Secondary | ICD-10-CM

## 2020-04-26 DIAGNOSIS — H40053 Ocular hypertension, bilateral: Secondary | ICD-10-CM

## 2020-04-26 DIAGNOSIS — H25813 Combined forms of age-related cataract, bilateral: Secondary | ICD-10-CM

## 2020-05-06 NOTE — Progress Notes (Addendum)
[facility] - Clinic Note  05/09/2020  CHIEF COMPLAINT Patient presents for Retina Follow Up  HISTORY OF PRESENT ILLNESS: Derrick Terry is a 59 y.o. male who presents to the clinic today for:   HPI    Retina Follow Up    Patient presents with  Diabetic Retinopathy.  In both eyes.  This started months ago.  Severity is moderate.  Duration of 5 weeks.  Since onset it is stable.  I, the attending physician,  performed the HPI with the patient and updated documentation appropriately.          Comments    59 y/o male pt here for 5 wk f/u for NPDR OU.  No change in [place] OU.  Denies pain, FOL, floaters.  No gtts.  BS unknown.  A1C 7.7 a few wks ago.       Last edited by [name], MD on 05/09/2020  3:27 PM. (History)       Referring physician: [name], MD [phone] [address] [place],  [place] [address]  HISTORICAL INFORMATION:   Selected notes from the MEDICAL RECORD NUMBER DEE - Referred by PCP, Dr. [name]   CURRENT MEDICATIONS: Current Outpatient Medications (Ophthalmic Drugs)  Medication Sig  . brimonidine (ALPHAGAN) 0.2 % ophthalmic solution Place 1 drop into both eyes 2 (two) times daily.  . prednisoLONE acetate (PRED FORTE) 1 % ophthalmic suspension Place 1 drop into the right eye 4 (four) times daily.   No current facility-administered medications for this visit. (Ophthalmic Drugs)   Current Outpatient Medications (Other)  Medication Sig  . ADDERALL XR 20 MG 24 hr capsule Take 20 mg by mouth every morning.  Marland Kitchen atorvastatin (LIPITOR) 10 MG tablet Take 10 mg by mouth daily.  . B-D UF III MINI PEN NEEDLES 31G X 5 MM MISC USE TO ADMINISTER VICTOZA ONCE A DAY  . busPIRone (BUSPAR) 5 MG tablet Take 5 mg by mouth at bedtime.  . celecoxib (CELEBREX) 200 MG capsule Take 200 mg by mouth daily.  . CONTOUR NEXT TEST test strip USE STRIP TO CHECK GLUCOSE ONCE DAILY  . cyclobenzaprine (FLEXERIL) 10 MG tablet Take 1 tablet (10 mg total)  by mouth 2 (two) times daily as needed.  . Empagliflozin-metFORMIN HCl (SYNJARDY PO) Take by mouth.  . levocetirizine (XYZAL) 5 MG tablet Take 5 mg by mouth every evening.  . lidocaine (XYLOCAINE) 5 % ointment Apply topically as needed.  . Liraglutide (VICTOZA [place]) Inject into the skin daily.  . metFORMIN (GLUCOPHAGE) 1000 MG tablet Take 1,000 mg by mouth 2 (two) times daily with a meal.  . Microlet Lancets MISC USE TO CHECK GLUCOSE ONCE DAILY  . phenazopyridine (PYRIDIUM) 100 MG tablet Take 100 mg by mouth 3 (three) times daily as needed.  . pioglitazone (ACTOS) 45 MG tablet Take 45 mg by mouth daily.  . predniSONE (STERAPRED UNI-PAK 48 TAB) 5 MG (48) TBPK tablet See admin instructions.   . quinapril (ACCUPRIL) 10 MG tablet Take 10 mg by mouth daily.  . tadalafil (CIALIS) 20 MG tablet Take 20 mg by mouth daily as needed.  . valACYclovir (VALTREX) 1000 MG tablet SMARTSIG:2 Tablet(s) By Mouth Every 12 Hours PRN  . valACYclovir (VALTREX) 500 MG tablet Take 500 mg by mouth as needed.   Marland Kitchen VICTOZA 18 MG/3ML SOPN Inject 1.8 mg into the skin daily.   No current facility-administered medications for this visit. (Other)      REVIEW OF SYSTEMS: ROS  Positive for: Endocrine, Eyes   Negative for: Constitutional, Gastrointestinal, Neurological, Skin, Genitourinary, Musculoskeletal, HENT, Cardiovascular, Respiratory, Psychiatric, Allergic/Imm, Heme/Lymph   Last edited by Celine MansBaxley, Andrew G, COA on 05/09/2020  2:17 PM. (History)       ALLERGIES Allergies  Allergen Reactions  . Toradol [Ketorolac Tromethamine]     itching    PAST MEDICAL HISTORY Past Medical History:  Diagnosis Date  . Cataract    Mixed form OU  . Diabetes mellitus without complication (HCC)   . Diabetic retinopathy (HCC)    NPDR OU  . ED (erectile dysfunction)   . Hematuria   . Herpes labialis    Past Surgical History:  Procedure Laterality Date  . KNEE ARTHROSCOPY Left   . KNEE ARTHROSCOPY Right     FAMILY  HISTORY History reviewed. No pertinent family history.  SOCIAL HISTORY Social History   Tobacco Use  . Smoking status: Never Smoker  . Smokeless tobacco: Never Used  Substance Use Topics  . Alcohol use: No  . Drug use: No         OPHTHALMIC EXAM:  Base Eye Exam    Visual Acuity (Snellen - Linear)      Right Left   Dist cc 20/30 -2 20/25   Dist ph cc NI 20/20 -2   Correction: Glasses       Tonometry (Tonopen, 2:20 PM)      Right Left   Pressure 17 14       Pupils      Dark Light Shape React APD   Right 3 2 Round Brisk None   Left 3 2 Round Brisk None       Visual Fields (Counting fingers)      Left Right    Full Full       Extraocular Movement      Right Left    Full, Ortho Full, Ortho       Neuro/Psych    Oriented x3: Yes   Mood/Affect: Normal       Dilation    Both eyes: 1.0% Mydriacyl, 2.5% Phenylephrine @ 2:20 PM        Slit Lamp and Fundus Exam    Slit Lamp Exam      Right Left   Lids/Lashes Mild Meibomian gland dysfunction Mild Meibomian gland dysfunction   Conjunctiva/Sclera Mild Melanosis Mild Melanosis   Cornea Debris in tear film, EBMD Trace Punctate epithelial erosions, Debris in tear film   Anterior Chamber Deep and clear, narrow temporal angle Deep and clear, narrow temporal angle   Iris Round and dilated, No NVI Round and dilated, No NVI   Lens 2+ Nuclear sclerosis, 2-3+ Cortical cataract 2-3+ Nuclear sclerosis, 3+ Cortical cataract   Vitreous Vitreous syneresis Vitreous syneresis       Fundus Exam      Right Left   Disc Pink and sharp, Mild Pallor, Sharp rim, +cupping Pink and sharp, central Pallor, Sharp rim, +cupping   C/D Ratio 0.5 0.5   Macula Blunted foveal reflex, persistent central cystic changes -- slighlty improved, scattered Microaneurysms / IRH greatest temporally, focal pigment clump ST macula Flat, Good foveal reflex, trace persistent cystic changes inferiorly -- improved from prior, scattered Microaneurysms and IRH  - improved, focal laser changes, focal DBH inferior macula   Vessels Vascular attenuation, mild Tortuousity Vascular attenuation, mild Tortuousity   Periphery Attached, scattered MA  Attached, large pigmented CR scar IT periphery, scattered MA / IRH, focal blot heme at 1000 off disc  IMAGING AND PROCEDURES  Imaging and Procedures for @TODAY @  OCT, Retina - OU - Both Eyes       Right Eye Quality was good. Central Foveal Thickness: 382. Progression has worsened. Findings include abnormal foveal contour, intraretinal fluid, no SRF, intraretinal hyper-reflective material (Mild Interval increase in central cystic changes).   Left Eye Quality was good. Central Foveal Thickness: 279. Progression has been stable. Findings include normal foveal contour, no SRF, no IRF (Stable improvement in cystic changes IT macula (post focal laser) -- resolved).   Notes *Images captured and stored on drive  Diagnosis / Impression:  +DME OU (OD>OS) OD: Mild Interval increase in central cystic changes OS: Stable improvement in cystic changes IT macula (post focal laser) -- resolved   Clinical management:  See below  Abbreviations: NFP - Normal foveal profile. CME - cystoid macular edema. PED - pigment epithelial detachment. IRF - intraretinal fluid. SRF - subretinal fluid. EZ - ellipsoid zone. ERM - epiretinal membrane. ORA - outer retinal atrophy. ORT - outer retinal tubulation. SRHM - subretinal hyper-reflective material        Intravitreal Injection, Pharmacologic Agent - OD - Right Eye       Time Out 05/09/2020. 3:31 PM. Confirmed correct patient, procedure, site, and patient consented.   Anesthesia Topical anesthesia was used. Anesthetic medications included Lidocaine 2%, Proparacaine 0.5%.   Procedure Preparation included 5% betadine to ocular surface, eyelid speculum. A (32g) needle was used.   Injection:  2 mg aflibercept 14/11/2019) SOLN   NDC: Gretta Cool, Lot: [id number],  Expiration date: 07/04/2020   Route: Intravitreal, Site: Right Eye, Waste: 0.05 mL  Post-op Post injection exam found visual acuity of at least counting fingers. The patient tolerated the procedure well. There were no complications. The patient received written and verbal post procedure care education.   Notes **SAMPLE MEDICATION ADMINISTERED**                ASSESSMENT/PLAN:    ICD-10-CM   1. Moderate nonproliferative diabetic retinopathy of both eyes with macular edema associated with type 2 diabetes mellitus (HCC)  07/06/2020 Intravitreal Injection, Pharmacologic Agent - OD - Right Eye    aflibercept (EYLEA) SOLN 2 mg    CANCELED: Intravitreal Injection, Pharmacologic Agent - OS - Left Eye  2. Retinal edema  H35.81 OCT, Retina - OU - Both Eyes  3. Combined forms of age-related cataract of both eyes  H25.813   4. Ocular hypertension, bilateral  H40.053   5. Hypertensive retinopathy of both eyes  H35.033     1,2. Moderate Non-proliferative diabetic retinopathy, OU             - S/P IVA OD #1 (11.03.20), #2 (12.01.20), #3 (01.15.21), #4 (02.12.21)  - S/P IVA OS #1 (11.16.20), #2 (01.15.21), #3 (02.12.21), #4 (03.12.21), #5 (04.09.21)  - discussed possible resistance to IVA OD and possible switch in medication  - S/P IVE OD #1 (03.12.21), #2 (04.09.21), #3 (05.13.21), #4 (06.10.21), #5 (07.15.21), #6 (08.30.21), #7 (09.27.21)             - S/P IVE OS #1 (05.13.21), #2 (06.10.21), #3 (07.15.21), #4 (08.30.21)             - S/P focal laser OS (09.13.21), OD (11.01.21)  - FA (11.16.2020) shows late leaking MA OU, mild peripheral nonperfusion OU, no NV OU  - exam shows scattered MA, IRH, clustered centrally OU  - OCT shows OD: Mild Interval increase in central cysts; OS: mild Interval improvement in cystic  changes IT macula (resolved post focal laser)  - recommend IVE OD #8 --SAMPLE-- only today, 12.06.21 -- recommend holding IVE OS  - pt wishes to proceed with injection  - RBA of  procedure discussed, questions answered  - Avastin informed consent form signed and scanned on 01.15.2021  - Eylea informed consent form signed and scanned on 03.12.2021  - see procedure note  - Eylea4U benefits investigation restarted 12.06.21 -- patient switched insurance  - f/u 4 weeks -- DFE/OCT, possible injection(s)  3. Mixed form age related cataract OU  - The symptoms of cataract, surgical options, and treatments and risks were discussed with patient.  - discussed diagnosis and progression  - not yet visually significant  - monitor for now  4. Ocular Hypertension OU  - IOP today -- 17,14  - cont brimonidine BID OU   Ophthalmic Meds Ordered this visit:  Meds ordered this encounter  Medications  . aflibercept (EYLEA) SOLN 2 mg   Return in about 4 weeks (around 06/06/2020) for f/u NPDR OU, DFE, OCT.  There are no Patient Instructions on file for this visit.  This document serves as a record of services personally performed by Karie Chimera, MD, PhD. It was created on their behalf by Herby Abraham, COA, an ophthalmic technician. The creation of this record is the provider's dictation and/or activities during the visit.    Electronically signed by: Herby Abraham, COA 12.03.2021 10:30 PM   This document serves as a record of services personally performed by Karie Chimera, MD, PhD. It was created on their behalf by Glee Arvin. Manson Passey, OA an ophthalmic technician. The creation of this record is the provider's dictation and/or activities during the visit.    Electronically signed by: Glee Arvin. Kristopher Oppenheim 12.06.2021 10:30 PM  Karie Chimera, M.D., Ph.D. Diseases & Surgery of the Retina and Vitreous [facility] 05/09/2020   I have reviewed the above documentation for accuracy and completeness, and I agree with the above. Karie Chimera, M.D., Ph.D. 05/09/20 10:30 PM   Abbreviations: M myopia (nearsighted); A astigmatism; H hyperopia (farsighted); P  presbyopia; Mrx spectacle prescription;  CTL contact lenses; OD right eye; OS left eye; OU both eyes  XT exotropia; ET esotropia; PEK punctate epithelial keratitis; PEE punctate epithelial erosions; DES dry eye syndrome; MGD meibomian gland dysfunction; ATs artificial tears; PFAT's preservative free artificial tears; NSC nuclear sclerotic cataract; PSC posterior subcapsular cataract; ERM epi-retinal membrane; PVD posterior vitreous detachment; RD retinal detachment; DM diabetes mellitus; DR diabetic retinopathy; NPDR non-proliferative diabetic retinopathy; PDR proliferative diabetic retinopathy; CSME clinically significant macular edema; DME diabetic macular edema; dbh dot blot hemorrhages; CWS cotton wool spot; POAG primary open angle glaucoma; C/D cup-to-disc ratio; HVF humphrey visual field; GVF goldmann visual field; OCT optical coherence tomography; IOP intraocular pressure; BRVO Branch retinal vein occlusion; CRVO central retinal vein occlusion; CRAO central retinal artery occlusion; BRAO branch retinal artery occlusion; RT retinal tear; SB scleral buckle; PPV pars plana vitrectomy; VH Vitreous hemorrhage; PRP panretinal laser photocoagulation; IVK intravitreal kenalog; VMT vitreomacular traction; MH Macular hole;  NVD neovascularization of the disc; NVE neovascularization elsewhere; AREDS age related eye disease study; ARMD age related macular degeneration; POAG primary open angle glaucoma; EBMD epithelial/anterior basement membrane dystrophy; ACIOL anterior chamber intraocular lens; IOL intraocular lens; PCIOL posterior chamber intraocular lens; Phaco/IOL phacoemulsification with intraocular lens placement; PRK photorefractive keratectomy; LASIK laser assisted in situ keratomileusis; HTN hypertension; DM diabetes mellitus; COPD chronic obstructive pulmonary disease

## 2020-05-09 ENCOUNTER — Ambulatory Visit (INDEPENDENT_AMBULATORY_CARE_PROVIDER_SITE_OTHER): Payer: PRIVATE HEALTH INSURANCE | Admitting: Ophthalmology

## 2020-05-09 ENCOUNTER — Encounter (INDEPENDENT_AMBULATORY_CARE_PROVIDER_SITE_OTHER): Payer: Self-pay | Admitting: Ophthalmology

## 2020-05-09 ENCOUNTER — Other Ambulatory Visit: Payer: Self-pay

## 2020-05-09 DIAGNOSIS — H35033 Hypertensive retinopathy, bilateral: Secondary | ICD-10-CM

## 2020-05-09 DIAGNOSIS — H25813 Combined forms of age-related cataract, bilateral: Secondary | ICD-10-CM

## 2020-05-09 DIAGNOSIS — H3581 Retinal edema: Secondary | ICD-10-CM | POA: Diagnosis not present

## 2020-05-09 DIAGNOSIS — E113313 Type 2 diabetes mellitus with moderate nonproliferative diabetic retinopathy with macular edema, bilateral: Secondary | ICD-10-CM

## 2020-05-09 DIAGNOSIS — H40053 Ocular hypertension, bilateral: Secondary | ICD-10-CM

## 2020-05-09 MED ORDER — AFLIBERCEPT 2MG/0.05ML IZ SOLN FOR KALEIDOSCOPE
2.0000 mg | INTRAVITREAL | Status: AC | PRN
  Administered 2020-05-09: 2 mg via INTRAVITREAL

## 2020-05-24 NOTE — Progress Notes (Signed)
[facility] - Clinic Note  06/06/2020  CHIEF COMPLAINT Patient presents for Retina Follow Up  HISTORY OF PRESENT ILLNESS: Derrick Terry is a 59 y.o. male who presents to the clinic today for:   HPI    Retina Follow Up    Patient presents with  Diabetic Retinopathy.  In both eyes.  This started weeks ago.  Duration of weeks.  Since onset it is stable.  I, the attending physician,  performed the HPI with the patient and updated documentation appropriately.          Comments    Pt states vision is the same OU.  Pt denies eye pain or discomfort and denies any new or worsening floaters or fol OU.       Last edited by [name], MD on 06/06/2020  1:23 PM. (History)       Referring physician: [name], MD [phone] [place],  [place] [address]  HISTORICAL INFORMATION:   Selected notes from the MEDICAL RECORD NUMBER DEE - Referred by PCP, Dr. [name]   CURRENT MEDICATIONS: Current Outpatient Medications (Ophthalmic Drugs)  Medication Sig  . brimonidine (ALPHAGAN) 0.2 % ophthalmic solution Place 1 drop into both eyes 2 (two) times daily.  . prednisoLONE acetate (PRED FORTE) 1 % ophthalmic suspension Place 1 drop into the right eye 4 (four) times daily.   No current facility-administered medications for this visit. (Ophthalmic Drugs)   Current Outpatient Medications (Other)  Medication Sig  . ADDERALL XR 20 MG 24 hr capsule Take 20 mg by mouth every morning.  Marland Kitchen atorvastatin (LIPITOR) 10 MG tablet Take 10 mg by mouth daily.  . B-D UF III MINI PEN NEEDLES 31G X 5 MM MISC USE TO ADMINISTER VICTOZA ONCE A DAY  . busPIRone (BUSPAR) 5 MG tablet Take 5 mg by mouth at bedtime.  . celecoxib (CELEBREX) 200 MG capsule Take 200 mg by mouth daily.  . CONTOUR NEXT TEST test strip USE STRIP TO CHECK GLUCOSE ONCE DAILY  . cyclobenzaprine (FLEXERIL) 10 MG tablet Take 1 tablet (10 mg total) by mouth 2 (two) times daily as needed.  .  Empagliflozin-metFORMIN HCl (SYNJARDY PO) Take by mouth.  . levocetirizine (XYZAL) 5 MG tablet Take 5 mg by mouth every evening.  . lidocaine (XYLOCAINE) 5 % ointment Apply topically as needed.  . Liraglutide (VICTOZA [place]) Inject into the skin daily.  . metFORMIN (GLUCOPHAGE) 1000 MG tablet Take 1,000 mg by mouth 2 (two) times daily with a meal.  . Microlet Lancets MISC USE TO CHECK GLUCOSE ONCE DAILY  . phenazopyridine (PYRIDIUM) 100 MG tablet Take 100 mg by mouth 3 (three) times daily as needed.  . pioglitazone (ACTOS) 45 MG tablet Take 45 mg by mouth daily.  . predniSONE (STERAPRED UNI-PAK 48 TAB) 5 MG (48) TBPK tablet See admin instructions.   . quinapril (ACCUPRIL) 10 MG tablet Take 10 mg by mouth daily.  . tadalafil (CIALIS) 20 MG tablet Take 20 mg by mouth daily as needed.  . valACYclovir (VALTREX) 1000 MG tablet SMARTSIG:2 Tablet(s) By Mouth Every 12 Hours PRN  . valACYclovir (VALTREX) 500 MG tablet Take 500 mg by mouth as needed.   Marland Kitchen VICTOZA 18 MG/3ML SOPN Inject 1.8 mg into the skin daily.   No current facility-administered medications for this visit. (Other)      REVIEW OF SYSTEMS: ROS    Positive for: Endocrine, Eyes   Negative for: Constitutional, Gastrointestinal, Neurological, Skin, Genitourinary, Musculoskeletal,  HENT, Cardiovascular, Respiratory, Psychiatric, Allergic/Imm, Heme/Lymph   Last edited by Corrinne Eagle on 06/06/2020  1:09 PM. (History)       ALLERGIES Allergies  Allergen Reactions  . Toradol [Ketorolac Tromethamine]     itching    PAST MEDICAL HISTORY Past Medical History:  Diagnosis Date  . Cataract    Mixed form OU  . Diabetes mellitus without complication (HCC)   . Diabetic retinopathy (HCC)    NPDR OU  . ED (erectile dysfunction)   . Hematuria   . Herpes labialis    Past Surgical History:  Procedure Laterality Date  . KNEE ARTHROSCOPY Left   . KNEE ARTHROSCOPY Right     FAMILY HISTORY History reviewed. No pertinent family  history.  SOCIAL HISTORY Social History   Tobacco Use  . Smoking status: Never Smoker  . Smokeless tobacco: Never Used  Substance Use Topics  . Alcohol use: No  . Drug use: No         OPHTHALMIC EXAM:  Base Eye Exam    Visual Acuity (Snellen - Linear)      Right Left   Dist cc 20/40 -1 20/25   Dist ph cc NI NI   Correction: Glasses       Tonometry (Tonopen, 1:12 PM)      Right Left   Pressure 21 17       Pupils      Dark Light Shape React APD   Right 3 2 Round Brisk 0   Left 3 2 Round Brisk 0       Visual Fields      Left Right    Full Full       Extraocular Movement      Right Left    Full Full       Neuro/Psych    Oriented x3: Yes   Mood/Affect: Normal       Dilation    Both eyes: 1.0% Mydriacyl, 2.5% Phenylephrine @ 1:12 PM        Slit Lamp and Fundus Exam    Slit Lamp Exam      Right Left   Lids/Lashes Mild Meibomian gland dysfunction Mild Meibomian gland dysfunction   Conjunctiva/Sclera Mild Melanosis Mild Melanosis   Cornea Debris in tear film Mild Debris in tear film   Anterior Chamber Deep and clear, narrow temporal angle Deep and clear, narrow temporal angle   Iris Round and dilated, No NVI Round and dilated, No NVI   Lens 2-3+ Nuclear sclerosis with brunescence, 2-3+ Cortical cataract 2-3+ Nuclear sclerosis with brunescence, 2-3+ Cortical cataract   Vitreous Vitreous syneresis Vitreous syneresis       Fundus Exam      Right Left   Disc Pink and sharp, Mild Pallor, Sharp rim, +cupping Pink and sharp, central Pallor, Sharp rim, +cupping   C/D Ratio 0.5 0.55   Macula Blunted foveal reflex, persistent central cystic changes -- slighlty improved, scattered Microaneurysms / IRH greatest temporally, focal pigment clump ST macula Flat, Good foveal reflex, trace persistent cystic changes inferiorly -- stably improved, scattered Microaneurysms and IRH - improved, focal laser changes, focal DBH inferior macula -- improved   Vessels Vascular  attenuation, mild Tortuousity Vascular attenuation, mild Tortuousity   Periphery Attached, scattered MA  Attached, large pigmented CR scar IT periphery, scattered MA / IRH          IMAGING AND PROCEDURES  Imaging and Procedures for @TODAY @  OCT, Retina - OU - Both Eyes  Right Eye Quality was good. Central Foveal Thickness: 346. Progression has improved. Findings include abnormal foveal contour, intraretinal fluid, no SRF, intraretinal hyper-reflective material (Mild Interval improvement in IRF/central cystic changes).   Left Eye Quality was good. Central Foveal Thickness: 275. Progression has been stable. Findings include normal foveal contour, no SRF, no IRF (Stable improvement in cystic changes IT macula (post focal laser) -- stably resolved).   Notes *Images captured and stored on drive  Diagnosis / Impression:  +DME OU (OD>OS) OD: Mild Interval increase in central cystic changes OS: Stable improvement in cystic changes IT macula (post focal laser) -- stably resolved   Clinical management:  See below  Abbreviations: NFP - Normal foveal profile. CME - cystoid macular edema. PED - pigment epithelial detachment. IRF - intraretinal fluid. SRF - subretinal fluid. EZ - ellipsoid zone. ERM - epiretinal membrane. ORA - outer retinal atrophy. ORT - outer retinal tubulation. SRHM - subretinal hyper-reflective material                 ASSESSMENT/PLAN:    ICD-10-CM   1. Moderate nonproliferative diabetic retinopathy of both eyes with macular edema associated with type 2 diabetes mellitus (HCC)  [id number] CANCELED: Intravitreal Injection, Pharmacologic Agent - OD - Right Eye  2. Retinal edema  H35.81 OCT, Retina - OU - Both Eyes  3. Combined forms of age-related cataract of both eyes  H25.813   4. Ocular hypertension, bilateral  H40.053     1,2. Moderate Non-proliferative diabetic retinopathy, OU             - S/P IVA OD #1 (11.03.20), #2 (12.01.20), #3 (01.15.21), #4  (02.12.21)  - S/P IVA OS #1 (11.16.20), #2 (01.15.21), #3 (02.12.21), #4 (03.12.21), #5 (04.09.21)  - discussed possible resistance to IVA OD and possible switch in medication  - S/P IVE OD #1 (03.12.21), #2 (04.09.21), #3 (05.13.21), #4 (06.10.21), #5 (07.15.21), #6 (08.30.21), #7 (09.27.21), #8 (12.06.21-SAMPLE)             - S/P IVE OS #1 (05.13.21), #2 (06.10.21), #3 (07.15.21), #4 (08.30.21)             - S/P focal laser OS (09.13.21), OD (11.01.21)  - FA (11.16.2020) shows late leaking MA OU, mild peripheral nonperfusion OU, no NV OU  - exam shows scattered MA, IRH, clustered centrally OU  - OCT shows OD: Mild Interval improvement in IRF/central cystic changes ; OS: Stable improvement in cystic changes IT macula (post focal laser) --stably resolved  - recommend IVE OD #9, 01.03.22 -- recommend holding IVE OS  - pt unable to proceed with injection OD due to unverified insurance coverage  - Avastin informed consent form signed and scanned on 01.15.2021  - Eylea informed consent form signed and scanned on 03.12.2021  - see procedure note  - [email or web address] benefits investigation restarted 12.06.21 -- patient switched insurance  - will call pt to schedule f/u once insurance verified  3. Mixed form age related cataract OU  - The symptoms of cataract, surgical options, and treatments and risks were discussed with patient.  - discussed diagnosis and progression  - not yet visually significant  - monitor for now  4. Ocular Hypertension OU  - IOP today -- 21,17  - cont brimonidine BID OU   Ophthalmic Meds Ordered this visit:  No orders of the defined types were placed in this encounter.  Return in about 4 weeks (around 07/04/2020) for f/u NPDR OU, DFE, OCT.  There are no Patient Instructions  on file for this visit.  This document serves as a record of services personally performed by Karie Chimera, MD, PhD. It was created on their behalf by Herby Abraham, COA, an ophthalmic technician. The  creation of this record is the provider's dictation and/or activities during the visit.    Electronically signed by: Herby Abraham, COA 12.21.2021 2:38 PM   This document serves as a record of services personally performed by Karie Chimera, MD, PhD. It was created on their behalf by Glee Arvin. Manson Passey, OA an ophthalmic technician. The creation of this record is the provider's dictation and/or activities during the visit.    Electronically signed by: Glee Arvin. Kristopher Oppenheim 01.03.2022 2:38 PM  Karie Chimera, M.D., Ph.D. Diseases & Surgery of the Retina and Vitreous [facility] 06/06/2020   I have reviewed the above documentation for accuracy and completeness, and I agree with the above. Karie Chimera, M.D., Ph.D. 06/06/20 2:38 PM   Abbreviations: M myopia (nearsighted); A astigmatism; H hyperopia (farsighted); P presbyopia; Mrx spectacle prescription;  CTL contact lenses; OD right eye; OS left eye; OU both eyes  XT exotropia; ET esotropia; PEK punctate epithelial keratitis; PEE punctate epithelial erosions; DES dry eye syndrome; MGD meibomian gland dysfunction; ATs artificial tears; PFAT's preservative free artificial tears; NSC nuclear sclerotic cataract; PSC posterior subcapsular cataract; ERM epi-retinal membrane; PVD posterior vitreous detachment; RD retinal detachment; DM diabetes mellitus; DR diabetic retinopathy; NPDR non-proliferative diabetic retinopathy; PDR proliferative diabetic retinopathy; CSME clinically significant macular edema; DME diabetic macular edema; dbh dot blot hemorrhages; CWS cotton wool spot; POAG primary open angle glaucoma; C/D cup-to-disc ratio; HVF humphrey visual field; GVF goldmann visual field; OCT optical coherence tomography; IOP intraocular pressure; BRVO Branch retinal vein occlusion; CRVO central retinal vein occlusion; CRAO central retinal artery occlusion; BRAO branch retinal artery occlusion; RT retinal tear; SB scleral buckle; PPV  pars plana vitrectomy; VH Vitreous hemorrhage; PRP panretinal laser photocoagulation; IVK intravitreal kenalog; VMT vitreomacular traction; MH Macular hole;  NVD neovascularization of the disc; NVE neovascularization elsewhere; AREDS age related eye disease study; ARMD age related macular degeneration; POAG primary open angle glaucoma; EBMD epithelial/anterior basement membrane dystrophy; ACIOL anterior chamber intraocular lens; IOL intraocular lens; PCIOL posterior chamber intraocular lens; Phaco/IOL phacoemulsification with intraocular lens placement; PRK photorefractive keratectomy; LASIK laser assisted in situ keratomileusis; HTN hypertension; DM diabetes mellitus; COPD chronic obstructive pulmonary disease

## 2020-06-06 ENCOUNTER — Other Ambulatory Visit: Payer: Self-pay

## 2020-06-06 ENCOUNTER — Encounter (INDEPENDENT_AMBULATORY_CARE_PROVIDER_SITE_OTHER): Payer: Self-pay | Admitting: Ophthalmology

## 2020-06-06 ENCOUNTER — Ambulatory Visit (INDEPENDENT_AMBULATORY_CARE_PROVIDER_SITE_OTHER): Payer: PRIVATE HEALTH INSURANCE | Admitting: Ophthalmology

## 2020-06-06 DIAGNOSIS — H3581 Retinal edema: Secondary | ICD-10-CM

## 2020-06-06 DIAGNOSIS — E113313 Type 2 diabetes mellitus with moderate nonproliferative diabetic retinopathy with macular edema, bilateral: Secondary | ICD-10-CM | POA: Diagnosis not present

## 2020-06-06 DIAGNOSIS — H25813 Combined forms of age-related cataract, bilateral: Secondary | ICD-10-CM | POA: Diagnosis not present

## 2020-06-06 DIAGNOSIS — H40053 Ocular hypertension, bilateral: Secondary | ICD-10-CM | POA: Diagnosis not present

## 2020-07-13 ENCOUNTER — Other Ambulatory Visit (INDEPENDENT_AMBULATORY_CARE_PROVIDER_SITE_OTHER): Payer: Self-pay | Admitting: Ophthalmology

## 2020-08-29 ENCOUNTER — Other Ambulatory Visit: Payer: Self-pay | Admitting: Urology

## 2023-09-03 ENCOUNTER — Other Ambulatory Visit (HOSPITAL_BASED_OUTPATIENT_CLINIC_OR_DEPARTMENT_OTHER): Payer: Self-pay | Admitting: Family Medicine

## 2023-09-03 DIAGNOSIS — Z136 Encounter for screening for cardiovascular disorders: Secondary | ICD-10-CM

## 2023-10-04 ENCOUNTER — Ambulatory Visit (HOSPITAL_BASED_OUTPATIENT_CLINIC_OR_DEPARTMENT_OTHER)
Admission: RE | Admit: 2023-10-04 | Discharge: 2023-10-04 | Disposition: A | Discharge status: Home / Self Care | Source: Ambulatory Visit | Attending: Family Medicine | Admitting: Family Medicine

## 2023-10-04 DIAGNOSIS — Z136 Encounter for screening for cardiovascular disorders: Secondary | ICD-10-CM | POA: Insufficient documentation

## 2024-03-30 ENCOUNTER — Other Ambulatory Visit: Payer: Self-pay | Admitting: *Deleted

## 2024-03-30 DIAGNOSIS — R55 Syncope and collapse: Secondary | ICD-10-CM

## 2024-04-22 ENCOUNTER — Ambulatory Visit: Attending: Family Medicine

## 2024-04-22 DIAGNOSIS — R55 Syncope and collapse: Secondary | ICD-10-CM

## 2024-04-30 DIAGNOSIS — R55 Syncope and collapse: Secondary | ICD-10-CM | POA: Diagnosis not present
